# Patient Record
Sex: Male | Born: 1965 | Race: White | Hispanic: No | Marital: Married | Smoking: Former smoker
Health system: Southern US, Community
[De-identification: ages and names within clinical notes are randomized; demographics above are authoritative.]

## PROBLEM LIST (undated history)

## (undated) DIAGNOSIS — R011 Cardiac murmur, unspecified: Secondary | ICD-10-CM

## (undated) DIAGNOSIS — Z87442 Personal history of urinary calculi: Secondary | ICD-10-CM

## (undated) DIAGNOSIS — Z8679 Personal history of other diseases of the circulatory system: Secondary | ICD-10-CM

---

## 2014-04-16 ENCOUNTER — Other Ambulatory Visit (HOSPITAL_COMMUNITY): Payer: Self-pay | Admitting: Pulmonary Disease

## 2014-04-16 ENCOUNTER — Ambulatory Visit (HOSPITAL_COMMUNITY)
Admission: RE | Admit: 2014-04-16 | Discharge: 2014-04-16 | Disposition: A | Discharge status: Home / Self Care | Payer: 59 | Source: Ambulatory Visit | Attending: Pulmonary Disease | Admitting: Pulmonary Disease

## 2014-04-16 DIAGNOSIS — R05 Cough: Secondary | ICD-10-CM | POA: Insufficient documentation

## 2014-04-16 DIAGNOSIS — R509 Fever, unspecified: Secondary | ICD-10-CM | POA: Diagnosis present

## 2015-05-11 ENCOUNTER — Emergency Department (HOSPITAL_COMMUNITY)
Admission: EM | Admit: 2015-05-11 | Discharge: 2015-05-11 | Disposition: A | Discharge status: Home / Self Care | Payer: BC Managed Care – PPO | Attending: Emergency Medicine | Admitting: Emergency Medicine

## 2015-05-11 ENCOUNTER — Emergency Department (HOSPITAL_COMMUNITY): Payer: BC Managed Care – PPO

## 2015-05-11 ENCOUNTER — Encounter (HOSPITAL_COMMUNITY): Payer: Self-pay | Admitting: *Deleted

## 2015-05-11 DIAGNOSIS — N200 Calculus of kidney: Secondary | ICD-10-CM | POA: Diagnosis not present

## 2015-05-11 DIAGNOSIS — R109 Unspecified abdominal pain: Secondary | ICD-10-CM | POA: Diagnosis present

## 2015-05-11 DIAGNOSIS — R110 Nausea: Secondary | ICD-10-CM | POA: Diagnosis not present

## 2015-05-11 HISTORY — DX: Personal history of other diseases of the circulatory system: Z86.79

## 2015-05-11 HISTORY — DX: Cardiac murmur, unspecified: R01.1

## 2015-05-11 LAB — BASIC METABOLIC PANEL
Anion gap: 8 (ref 5–15)
BUN: 19 mg/dL (ref 6–20)
CALCIUM: 8.8 mg/dL — AB (ref 8.9–10.3)
CO2: 24 mmol/L (ref 22–32)
CREATININE: 1.09 mg/dL (ref 0.61–1.24)
Chloride: 104 mmol/L (ref 101–111)
GFR calc non Af Amer: >60 mL/min (ref >60)
Glucose, Bld: 116 mg/dL — ABNORMAL HIGH (ref 65–99)
Potassium: 4.2 mmol/L (ref 3.5–5.1)
SODIUM: 136 mmol/L (ref 135–145)

## 2015-05-11 LAB — CBC WITH DIFFERENTIAL/PLATELET
Basophils Absolute: 0.1 10*3/uL (ref 0.0–0.1)
Basophils Relative: 1 %
Eosinophils Absolute: 0.3 10*3/uL (ref 0.0–0.7)
Eosinophils Relative: 3 %
HEMATOCRIT: 41.9 % (ref 39.0–52.0)
Hemoglobin: 14.2 g/dL (ref 13.0–17.0)
LYMPHS ABS: 2.1 10*3/uL (ref 0.7–4.0)
LYMPHS PCT: 24 %
MCH: 29.9 pg (ref 26.0–34.0)
MCHC: 33.9 g/dL (ref 30.0–36.0)
MCV: 88.2 fL (ref 78.0–100.0)
Monocytes Absolute: 0.7 10*3/uL (ref 0.1–1.0)
Monocytes Relative: 8 %
Neutro Abs: 5.8 10*3/uL (ref 1.7–7.7)
Neutrophils Relative %: 64 %
PLATELETS: 230 10*3/uL (ref 150–400)
RBC: 4.75 MIL/uL (ref 4.22–5.81)
RDW: 12.9 % (ref 11.5–15.5)
WBC: 8.9 10*3/uL (ref 4.0–10.5)

## 2015-05-11 LAB — URINALYSIS, ROUTINE W REFLEX MICROSCOPIC
Bilirubin Urine: NEGATIVE
Glucose, UA: NEGATIVE mg/dL
Ketones, ur: NEGATIVE mg/dL
Leukocytes, UA: NEGATIVE
Nitrite: NEGATIVE
PROTEIN: NEGATIVE mg/dL
SPECIFIC GRAVITY, URINE: 1.015 (ref 1.005–1.030)
pH: 7 (ref 5.0–8.0)

## 2015-05-11 LAB — URINE MICROSCOPIC-ADD ON

## 2015-05-11 MED ORDER — KETOROLAC TROMETHAMINE 30 MG/ML IJ SOLN
30.0000 mg | Freq: Once | INTRAMUSCULAR | Status: AC
  Administered 2015-05-11: 30 mg via INTRAVENOUS
  Filled 2015-05-11: qty 1

## 2015-05-11 MED ORDER — HYDROMORPHONE HCL 1 MG/ML IJ SOLN
1.0000 mg | Freq: Once | INTRAMUSCULAR | Status: AC
  Administered 2015-05-11: 1 mg via INTRAVENOUS
  Filled 2015-05-11: qty 1

## 2015-05-11 MED ORDER — SODIUM CHLORIDE 0.9 % IV BOLUS (SEPSIS)
1000.0000 mL | Freq: Once | INTRAVENOUS | Status: AC
  Administered 2015-05-11: 1000 mL via INTRAVENOUS

## 2015-05-11 MED ORDER — ONDANSETRON HCL 4 MG/2ML IJ SOLN
4.0000 mg | Freq: Once | INTRAMUSCULAR | Status: AC
  Administered 2015-05-11: 4 mg via INTRAVENOUS
  Filled 2015-05-11: qty 2

## 2015-05-11 MED ORDER — ONDANSETRON 4 MG PO TBDP: 4.0000 mg | ORAL_TABLET | Freq: Three times a day (TID) | ORAL | Status: DC | PRN

## 2015-05-11 MED ORDER — MORPHINE SULFATE (PF) 4 MG/ML IV SOLN
4.0000 mg | Freq: Once | INTRAVENOUS | Status: AC
  Administered 2015-05-11: 4 mg via INTRAVENOUS
  Filled 2015-05-11: qty 1

## 2015-05-11 MED ORDER — TAMSULOSIN HCL 0.4 MG PO CAPS
0.4000 mg | ORAL_CAPSULE | Freq: Every day | ORAL | Status: DC
Start: 2015-05-11 — End: 2017-08-25

## 2015-05-11 MED ORDER — OXYCODONE-ACETAMINOPHEN 5-325 MG PO TABS: 1.0000 | ORAL_TABLET | Freq: Four times a day (QID) | ORAL | Status: DC | PRN

## 2015-05-11 NOTE — Discharge Instructions (Signed)
Kidney Stones °Kidney stones (urolithiasis) are deposits that form inside your kidneys. The intense pain is caused by the stone moving through the urinary tract. When the stone moves, the ureter goes into spasm around the stone. The stone is usually passed in the urine.  °CAUSES  °· A disorder that makes certain neck glands produce too much parathyroid hormone (primary hyperparathyroidism). °· A buildup of uric acid crystals, similar to gout in your joints. °· Narrowing (stricture) of the ureter. °· A kidney obstruction present at birth (congenital obstruction). °· Previous surgery on the kidney or ureters. °· Numerous kidney infections. °SYMPTOMS  °· Feeling sick to your stomach (nauseous). °· Throwing up (vomiting). °· Blood in the urine (hematuria). °· Pain that usually spreads (radiates) to the groin. °· Frequency or urgency of urination. °DIAGNOSIS  °· Taking a history and physical exam. °· Blood or urine tests. °· CT scan. °· Occasionally, an examination of the inside of the urinary bladder (cystoscopy) is performed. °TREATMENT  °· Observation. °· Increasing your fluid intake. °· Extracorporeal shock wave lithotripsy--This is a noninvasive procedure that uses shock waves to break up kidney stones. °· Surgery may be needed if you have severe pain or persistent obstruction. There are various surgical procedures. Most of the procedures are performed with the use of small instruments. Only small incisions are needed to accommodate these instruments, so recovery time is minimized. °The size, location, and chemical composition are all important variables that will determine the proper choice of action for you. Talk to your health care provider to better understand your situation so that you will minimize the risk of injury to yourself and your kidney.  °HOME CARE INSTRUCTIONS  °· Drink enough water and fluids to keep your urine clear or pale yellow. This will help you to pass the stone or stone fragments. °· Strain  all urine through the provided strainer. Keep all particulate matter and stones for your health care provider to see. The stone causing the pain may be as small as a grain of salt. It is very important to use the strainer each and every time you pass your urine. The collection of your stone will allow your health care provider to analyze it and verify that a stone has actually passed. The stone analysis will often identify what you can do to reduce the incidence of recurrences. °· Only take over-the-counter or prescription medicines for pain, discomfort, or fever as directed by your health care provider. °· Keep all follow-up visits as told by your health care provider. This is important. °· Get follow-up X-rays if required. The absence of pain does not always mean that the stone has passed. It may have only stopped moving. If the urine remains completely obstructed, it can cause loss of kidney function or even complete destruction of the kidney. It is your responsibility to make sure X-rays and follow-ups are completed. Ultrasounds of the kidney can show blockages and the status of the kidney. Ultrasounds are not associated with any radiation and can be performed easily in a matter of minutes. °· Make changes to your daily diet as told by your health care provider. You may be told to: °¨ Limit the amount of salt that you eat. °¨ Eat 5 or more servings of fruits and vegetables each day. °¨ Limit the amount of meat, poultry, fish, and eggs that you eat. °· Collect a 24-hour urine sample as told by your health care provider. You may need to collect another urine sample every 6-12   months. °SEEK MEDICAL CARE IF: °· You experience pain that is progressive and unresponsive to any pain medicine you have been prescribed. °SEEK IMMEDIATE MEDICAL CARE IF:  °· Pain cannot be controlled with the prescribed medicine. °· You have a fever or shaking chills. °· The severity or intensity of pain increases over 18 hours and is not  relieved by pain medicine. °· You develop a new onset of abdominal pain. °· You feel faint or pass out. °· You are unable to urinate. °  °This information is not intended to replace advice given to you by your health care provider. Make sure you discuss any questions you have with your health care provider. °  °Document Released: 01/05/2005 Document Revised: 09/26/2014 Document Reviewed: 06/08/2012 °Elsevier Interactive Patient Education ©2016 [employer] ° °

## 2015-05-11 NOTE — ED Notes (Signed)
Patient was transported to vehicle via wheelchair, NAD, VSS.

## 2015-05-11 NOTE — ED Provider Notes (Signed)
CSN: [id number]     Arrival date & time 05/11/15  [id number] History   First MD Initiated Contact with Patient 05/11/15 0329     Chief Complaint  Patient presents with  . Flank Pain     (Consider location/radiation/quality/duration/timing/severity/associated sxs/prior Treatment) HPI  This is a 50 year old male who presents with left flank pain. Onset of pain 11:30 PM. It was abrupt in onset. Denies any history of the same. No history of kidney stones. Reports associated nausea. No vomiting or diarrhea. Denies hematuria or dysuria. Currently rates his pain at 5 out of 10. He states that it waxes and wanes. It is sharp and radiates from his left flank into his left lower abdomen. He has not taken anything for the pain.  Past Medical History  Diagnosis Date  . Heart murmur after rheumatic heart disease    History reviewed. No pertinent past surgical history. No family history on file. Social History  Substance Use Topics  . Smoking status: Never Smoker   . Smokeless tobacco: None  . Alcohol Use: Yes     Comment: occ    Review of Systems  Constitutional: Negative for fever.  Gastrointestinal: Positive for nausea. Negative for vomiting, abdominal pain and diarrhea.  Genitourinary: Positive for flank pain. Negative for dysuria and hematuria.  All other systems reviewed and are negative.     Allergies  Review of patient's allergies indicates no known allergies.  Home Medications   Prior to Admission medications   Medication Sig Start Date End Date Taking? Authorizing Provider  ondansetron (ZOFRAN ODT) 4 MG disintegrating tablet Take 1 tablet (4 mg total) by mouth every 8 (eight) hours as needed for nausea or vomiting. 05/11/15   Merryl Hacker, MD  oxyCODONE-acetaminophen (PERCOCET/ROXICET) 5-325 MG tablet Take 1-2 tablets by mouth every 6 (six) hours as needed for severe pain. 05/11/15   Merryl Hacker, MD  tamsulosin (FLOMAX) 0.4 MG CAPS capsule Take 1 capsule (0.4 mg total) by  mouth daily. 05/11/15   Merryl Hacker, MD   BP 129/82 mmHg  Pulse 61  Temp(Src) 97.7 F (36.5 C) (Oral)  Resp 16  Ht 5\' 6"  (1.676 m)  Wt 160 lb (72.576 kg)  BMI 25.84 kg/m2  SpO2 97% Physical Exam  Constitutional: He is oriented to person, place, and time. He appears well-developed and well-nourished. No distress.  HENT:  Head: Normocephalic and atraumatic.  Cardiovascular: Normal rate, regular rhythm and normal heart sounds.   No murmur heard. Pulmonary/Chest: Effort normal and breath sounds normal. No respiratory distress. He has no wheezes.  Abdominal: Soft. Bowel sounds are normal. There is no tenderness. There is no rebound and no guarding.  Musculoskeletal: He exhibits no edema.  Neurological: He is alert and oriented to person, place, and time.  Skin: Skin is warm and dry.  Psychiatric: He has a normal mood and affect.  Nursing note and vitals reviewed.   ED Course  Procedures (including critical care time) Labs Review Labs Reviewed  URINALYSIS, ROUTINE W REFLEX MICROSCOPIC (NOT AT [facility]) - Abnormal; Notable for the following:    Hgb urine dipstick TRACE (*)    All other components within normal limits  BASIC METABOLIC PANEL - Abnormal; Notable for the following:    Glucose, Bld 116 (*)    Calcium 8.8 (*)    All other components within normal limits  URINE MICROSCOPIC-ADD ON - Abnormal; Notable for the following:    Squamous Epithelial / LPF 0-5 (*)    Bacteria, UA  RARE (*)    All other components within normal limits  CBC WITH DIFFERENTIAL/PLATELET    Imaging Review Ct Renal Stone Study  05/11/2015  CLINICAL DATA:  Initial evaluation for acute left flank pain. EXAM: CT ABDOMEN AND PELVIS WITHOUT CONTRAST TECHNIQUE: Multidetector CT imaging of the abdomen and pelvis was performed following the standard protocol without IV contrast. COMPARISON:  None. FINDINGS: Mild subsegmental atelectasis seen dependently within the visualized lung bases. Visualized lungs are  otherwise clear. Trace pericardial fluid noted. Few scattered subcentimeter hypodensities noted within the right hepatic lobe, too small the characterize, but may reflect small cysts. Limited noncontrast evaluation of the liver is otherwise unremarkable. Gallbladder within normal limits. No biliary dilatation. Spleen, adrenal glands, and pancreas demonstrate a normal unenhanced appearance. Scattered nonobstructive stones present within the right kidney, measuring up to 6 mm. No right-sided hydronephrosis or hydroureter. No radiopaque calculi seen along the course of the right renal collecting system. On the left, there is mild left hydroureteronephrosis. Obstructive 4 mm stone within the bladder lumen just beyond the left UVJ. No other radiopaque calculi seen within the dilated left ureter. Additional nonobstructive stones measuring up to 5 mm present within the mid and lower left kidney. Small hiatal hernia noted. Stomach otherwise unremarkable. No evidence for bowel obstruction. No abnormal wall thickening or inflammatory fat stranding seen about the bowels. Sigmoid diverticulosis without evidence for acute diverticulitis. Appendix is normal. Bladder partially distended with 4 mm stone in the bladder lumen. Prostate normal. No free air or fluid. No adenopathy. Small fat containing paraumbilical hernia noted. No acute osseous abnormality. Degenerative disc bulge noted at L5-S1. No worrisome lytic or blastic osseous lesions. IMPRESSION: 1. 4 mm obstructive stone within the bladder lumen just beyond the left UVJ with secondary mild left hydroureteronephrosis. 2. Additional nonobstructive nephrolithiasis as above. 3. Colonic diverticulosis without evidence for acute diverticulitis. Electronically Signed   By: Jeannine Boga M.D.   On: 05/11/2015 05:40   I have personally reviewed and evaluated these images and lab results as part of my medical decision-making.   EKG Interpretation None      MDM    Final diagnoses:  Kidney stone    Patient presents with acute onset of left flank pain. Currently he appears comfortable but states that the pain waxes and wanes. No history of kidney stones but history is suggestive. Exam is reassuring. Vital signs stable. Patient was given pain and nausea medication. Lab work was obtained as well as a CT scan given that he has no prior known history. CT scan shows a 4 mm obstructing stone at the bladder lumen. This is likely the cause of the patient's pain. On recheck, he states he feels much better. He was able to tolerate fluids. Patient was given instructions regarding expectant management.  After history, exam, and medical workup I feel the patient has been appropriately medically screened and is safe for discharge home. Pertinent diagnoses were discussed with the patient. Patient was given return precautions.     Merryl Hacker, MD 05/11/15 [phone]

## 2015-05-11 NOTE — ED Notes (Signed)
Patient was given water and peanut butter crackers for consumption.

## 2015-05-11 NOTE — ED Notes (Signed)
Pt states left flank pain that started around 2330

## 2016-01-28 DIAGNOSIS — J00 Acute nasopharyngitis [common cold]: Secondary | ICD-10-CM | POA: Diagnosis not present

## 2016-10-05 DIAGNOSIS — R05 Cough: Secondary | ICD-10-CM | POA: Diagnosis not present

## 2016-10-05 DIAGNOSIS — W57XXXA Bitten or stung by nonvenomous insect and other nonvenomous arthropods, initial encounter: Secondary | ICD-10-CM | POA: Diagnosis not present

## 2017-03-04 DIAGNOSIS — R300 Dysuria: Secondary | ICD-10-CM | POA: Diagnosis not present

## 2017-03-04 DIAGNOSIS — M545 Low back pain: Secondary | ICD-10-CM | POA: Diagnosis not present

## 2017-03-11 ENCOUNTER — Encounter (INDEPENDENT_AMBULATORY_CARE_PROVIDER_SITE_OTHER): Payer: Self-pay | Admitting: *Deleted

## 2017-05-10 ENCOUNTER — Encounter (INDEPENDENT_AMBULATORY_CARE_PROVIDER_SITE_OTHER): Payer: Self-pay | Admitting: *Deleted

## 2017-05-14 ENCOUNTER — Other Ambulatory Visit (INDEPENDENT_AMBULATORY_CARE_PROVIDER_SITE_OTHER): Payer: Self-pay | Admitting: *Deleted

## 2017-05-14 DIAGNOSIS — Z1211 Encounter for screening for malignant neoplasm of colon: Secondary | ICD-10-CM | POA: Insufficient documentation

## 2017-07-19 ENCOUNTER — Telehealth (INDEPENDENT_AMBULATORY_CARE_PROVIDER_SITE_OTHER): Payer: Self-pay | Admitting: *Deleted

## 2017-07-19 ENCOUNTER — Encounter (INDEPENDENT_AMBULATORY_CARE_PROVIDER_SITE_OTHER): Payer: Self-pay | Admitting: *Deleted

## 2017-07-19 MED ORDER — PEG 3350-KCL-NA BICARB-NACL 420 G PO SOLR: 4000.0000 mL | Freq: Once | ORAL | 0 refills | Status: AC

## 2017-07-19 NOTE — Telephone Encounter (Signed)
Patient needs trilyte 

## 2017-07-27 ENCOUNTER — Telehealth (INDEPENDENT_AMBULATORY_CARE_PROVIDER_SITE_OTHER): Payer: Self-pay | Admitting: *Deleted

## 2017-07-27 NOTE — Telephone Encounter (Signed)
agree

## 2017-07-27 NOTE — Telephone Encounter (Signed)
Referring MD/PCP: hawkins   Procedure: tcs  Reason/Indication:  screening  Has patient had this procedure before?  no  If so, when, by whom and where?    Is there a family history of colon cancer?  no  Who?  What age when diagnosed?    Is patient diabetic?   no      Does patient have prosthetic heart valve or mechanical valve?  no  Do you have a pacemaker?  no  Has patient ever had endocarditis? no  Has patient had joint replacement within last 12 months?  no  Is patient constipated or do they take laxatives? no  Does patient have a history of alcohol/drug use?  no  Is patient on blood thinner such as Coumadin, Plavix and/or Aspirin? no  Medications: none  Allergies: nkda  Medication Adjustment per Dr Lindi Adie, NP:   Procedure date & time: 08/25/17 at 830

## 2017-08-25 ENCOUNTER — Encounter (HOSPITAL_COMMUNITY)
Admission: RE | Disposition: A | Discharge status: Home / Self Care | Payer: Self-pay | Source: Ambulatory Visit | Attending: Internal Medicine

## 2017-08-25 ENCOUNTER — Encounter (HOSPITAL_COMMUNITY): Payer: Self-pay | Admitting: *Deleted

## 2017-08-25 ENCOUNTER — Other Ambulatory Visit: Payer: Self-pay

## 2017-08-25 ENCOUNTER — Ambulatory Visit (HOSPITAL_COMMUNITY)
Admission: RE | Admit: 2017-08-25 | Discharge: 2017-08-25 | Disposition: A | Discharge status: Home / Self Care | Payer: BLUE CROSS/BLUE SHIELD | Source: Ambulatory Visit | Attending: Internal Medicine | Admitting: Internal Medicine

## 2017-08-25 DIAGNOSIS — K5730 Diverticulosis of large intestine without perforation or abscess without bleeding: Secondary | ICD-10-CM | POA: Diagnosis not present

## 2017-08-25 DIAGNOSIS — Z8679 Personal history of other diseases of the circulatory system: Secondary | ICD-10-CM | POA: Diagnosis not present

## 2017-08-25 DIAGNOSIS — Z87891 Personal history of nicotine dependence: Secondary | ICD-10-CM | POA: Diagnosis not present

## 2017-08-25 DIAGNOSIS — Z79899 Other long term (current) drug therapy: Secondary | ICD-10-CM | POA: Insufficient documentation

## 2017-08-25 DIAGNOSIS — Z87442 Personal history of urinary calculi: Secondary | ICD-10-CM | POA: Insufficient documentation

## 2017-08-25 DIAGNOSIS — Z1211 Encounter for screening for malignant neoplasm of colon: Secondary | ICD-10-CM | POA: Diagnosis not present

## 2017-08-25 DIAGNOSIS — D123 Benign neoplasm of transverse colon: Secondary | ICD-10-CM | POA: Diagnosis not present

## 2017-08-25 HISTORY — PX: POLYPECTOMY: SHX5525

## 2017-08-25 HISTORY — DX: Personal history of urinary calculi: Z87.442

## 2017-08-25 HISTORY — PX: COLONOSCOPY: SHX5424

## 2017-08-25 SURGERY — COLONOSCOPY
Anesthesia: Moderate Sedation

## 2017-08-25 MED ORDER — MIDAZOLAM HCL 5 MG/5ML IJ SOLN
INTRAMUSCULAR | Status: DC | PRN
  Administered 2017-08-25: 2 mg via INTRAVENOUS
  Administered 2017-08-25: 1 mg via INTRAVENOUS
  Administered 2017-08-25: 2 mg via INTRAVENOUS

## 2017-08-25 MED ORDER — STERILE WATER FOR IRRIGATION IR SOLN
Status: DC | PRN
  Administered 2017-08-25: 09:00:00

## 2017-08-25 MED ORDER — SODIUM CHLORIDE 0.9 % IV SOLN
INTRAVENOUS | Status: DC
  Administered 2017-08-25: 1000 mL via INTRAVENOUS

## 2017-08-25 MED ORDER — MIDAZOLAM HCL 5 MG/5ML IJ SOLN
INTRAMUSCULAR | Status: AC
  Filled 2017-08-25: qty 10

## 2017-08-25 MED ORDER — MEPERIDINE HCL 50 MG/ML IJ SOLN
INTRAMUSCULAR | Status: AC
  Filled 2017-08-25: qty 1

## 2017-08-25 MED ORDER — MEPERIDINE HCL 50 MG/ML IJ SOLN
INTRAMUSCULAR | Status: DC | PRN
  Administered 2017-08-25 (×2): 25 mg via INTRAVENOUS

## 2017-08-25 NOTE — Op Note (Signed)
[facility] Patient Name: Kurt Rosales Procedure Date: 08/25/2017 9:06 AM MRN: [id number] Date of Birth: 13-Jul-1965 Attending MD: Hildred Laser , MD CSN: [id number] Age: 52 Admit Type: Outpatient Procedure:                Colonoscopy Indications:              Screening for colorectal malignant neoplasm Providers:                Hildred Laser, MD, Otis Peak B. Sharon Seller, RN, Nelma Rothman, Technician Referring MD:             Jasper Loser. Luan Pulling, MD Medicines:                Meperidine 50 mg IV, Midazolam 5 mg IV Complications:            No immediate complications. Estimated Blood Loss:     Estimated blood loss was minimal. Procedure:                Pre-Anesthesia Assessment:                           - Prior to the procedure, a History and Physical                            was performed, and patient medications and                            allergies were reviewed. The patient's tolerance of                            previous anesthesia was also reviewed. The risks                            and benefits of the procedure and the sedation                            options and risks were discussed with the patient.                            All questions were answered, and informed consent                            was obtained. Prior Anticoagulants: The patient has                            taken no previous anticoagulant or antiplatelet                            agents. ASA Grade Assessment: I - A normal, healthy                            patient. After reviewing the risks and benefits,  the patient was deemed in satisfactory condition to                            undergo the procedure.                           After obtaining informed consent, the colonoscope                            was passed under direct vision. Throughout the                            procedure, the patient's blood pressure, pulse, and                oxygen saturations were monitored continuously. The                            [id number]-[id number] ([id number]) scope was introduced through                            the anus and advanced to the the cecum, identified                            by appendiceal orifice and ileocecal valve. The                            colonoscopy was performed without difficulty. The                            patient tolerated the procedure well. The quality                            of the bowel preparation was good. The ileocecal                            valve, appendiceal orifice, and rectum were                            photographed. Scope In: 9:29:11 AM Scope Out: 9:54:33 AM Scope Withdrawal Time: 0 hours 19 minutes 9 seconds  Total Procedure Duration: 0 hours 25 minutes 22 seconds  Findings:      The perianal and digital rectal examinations were normal.      Three sessile polyps were found in the splenic flexure and hepatic       flexure. The polyps were 4 to 6 mm in size. These polyps were removed       with a cold snare. Resection was complete, but the polyp tissue was not       retrieved. The pathology specimen was placed into Bottle Number 1.      Multiple small and medium mouthed diverticula were found in the sigmoid       colon.      The retroflexed view of the distal rectum and anal verge was normal and       showed no anal or rectal abnormalities. Impression:               -  Three 4 to 6 mm polyps at the splenic flexure and                            at the hepatic flexure, removed with a cold snare.                            Complete resection. One polyp tissue not retrieved.                           - Diverticulosis in the sigmoid colon. Moderate Sedation:      Moderate (conscious) sedation was administered by the endoscopy nurse       and supervised by the endoscopist. The following parameters were       monitored: oxygen saturation, heart rate, blood pressure, CO2        capnography and response to care. Total physician intraservice time was       31 minutes. Recommendation:           - Patient has a contact number available for                            emergencies. The signs and symptoms of potential                            delayed complications were discussed with the                            patient. Return to normal activities tomorrow.                            Written discharge instructions were provided to the                            patient.                           - High fiber diet today.                           - Continue present medications.                           - No aspirin, ibuprofen, naproxen, or other                            non-steroidal anti-inflammatory drugs for 1 day.                           - Await pathology results.                           - Repeat colonoscopy is recommended. The                            colonoscopy date will be determined after pathology  results from today's exam become available for                            review. Procedure Code(s):        --- Professional ---                           [phone], Colonoscopy, flexible; with removal of                            tumor(s), polyp(s), or other lesion(s) by snare                            technique                           G0500, Moderate sedation services provided by the                            same physician or other qualified health care                            professional performing a gastrointestinal                            endoscopic service that sedation supports,                            requiring the presence of an independent trained                            observer to assist in the monitoring of the                            patient's level of consciousness and physiological                            status; initial 15 minutes of intra-service time;                            patient  age 1 years or older (additional time may                            be reported with [phone], as appropriate)                           [id number], Moderate sedation services provided by the                            same physician or other qualified health care                            professional performing the diagnostic or  therapeutic service that the sedation supports,                            requiring the presence of an independent trained                            observer to assist in the monitoring of the                            patient's level of consciousness and physiological                            status; each additional 15 minutes intraservice                            time (List separately in addition to code for                            primary service) Diagnosis Code(s):        --- Professional ---                           Z12.11, Encounter for screening for malignant                            neoplasm of colon                           D12.3, Benign neoplasm of transverse colon (hepatic                            flexure or splenic flexure)                           K57.30, Diverticulosis of large intestine without                            perforation or abscess without bleeding CPT copyright 2017 [employer]. All rights reserved. The codes documented in this report are preliminary and upon coder review may  be revised to meet current compliance requirements. Hildred Laser, MD Hildred Laser, MD 08/25/2017 10:02:09 AM This report has been signed electronically. Number of Addenda: 0

## 2017-08-25 NOTE — H&P (Addendum)
Kurt Rosales is an 52 y.o. male.   Chief Complaint: Patient is here for colonoscopy. HPI: Patient 52 year old Caucasian male who is here for screening colonoscopy.  He denies abdominal pain change in bowel habits or rectal bleeding. Family history is not available as he was adopted.  Past Medical History:  Diagnosis Date  . Heart murmur after rheumatic heart disease    had Rheumatic fever as a child  . History of kidney stones     History reviewed. No pertinent surgical history.  History reviewed. No pertinent family history. Social History:  reports that he has quit smoking. He has quit using smokeless tobacco. He reports that he drinks alcohol. He reports that he does not use drugs.  Allergies: No Known Allergies  Medications Prior to Admission  Medication Sig Dispense Refill  . cetirizine (ZYRTEC) 10 MG tablet Take 10 mg by mouth daily as needed for allergies.    . fluticasone (FLONASE) 50 MCG/ACT nasal spray Place 1 spray into both nostrils daily as needed for allergies or rhinitis.    Marland Kitchen MELATONIN PO Take 1 tablet by mouth at bedtime as needed (sleep).    Marland Kitchen ibuprofen (ADVIL,MOTRIN) 200 MG tablet Take 400-600 mg by mouth daily as needed for headache or moderate pain.    . tamsulosin (FLOMAX) 0.4 MG CAPS capsule Take 1 capsule (0.4 mg total) by mouth daily. (Patient not taking: Reported on 08/19/2017) 15 capsule 0    No results found for this or any previous visit (from the past 48 hour(s)). No results found.  ROS  Blood pressure 114/78, pulse (!) 58, temperature 98.2 F (36.8 C), temperature source Oral, resp. rate 17, height 5\' 6"  (1.676 m), weight 160 lb (72.6 kg), SpO2 100 %. Physical Exam  Constitutional: He appears well-developed and well-nourished.  HENT:  Mouth/Throat: Oropharynx is clear and moist.  Eyes: Conjunctivae are normal. No scleral icterus.  Neck: No thyromegaly present.  Cardiovascular: Normal rate, regular rhythm and normal heart sounds.  No murmur  heard. Respiratory: Effort normal and breath sounds normal.  GI: Soft. He exhibits no distension and no mass. There is no tenderness.  Musculoskeletal: He exhibits no edema.  Neurological: He is alert.  Skin: Skin is warm and dry.     Assessment/Plan   Average risk screening colonoscopy.   Hildred Laser, MD 08/25/2017, 9:20 AM

## 2017-08-25 NOTE — Discharge Instructions (Signed)
No aspirin or NSAIDs for 1 week. Resume usual medications as before. High-fiber diet. No driving for 24 hours.   Physician will call with biopsy results.   Colonoscopy, Adult, Care After This sheet gives you information about how to care for yourself after your procedure. Your health care provider may also give you more specific instructions. If you have problems or questions, contact your health care provider. What can I expect after the procedure? After the procedure, it is common to have:  A small amount of blood in your stool for 24 hours after the procedure.  Some gas.  Mild abdominal cramping or bloating.  Follow these instructions at home: General instructions   For the first 24 hours after the procedure: ? Do not drive or use machinery. ? Do not sign important documents. ? Do not drink alcohol. ? Do your regular daily activities at a slower pace than normal. ? Eat soft, easy-to-digest foods. ? Rest often.  Take over-the-counter or prescription medicines only as told by your health care provider.  It is up to you to get the results of your procedure. Ask your health care provider, or the department performing the procedure, when your results will be ready. Relieving cramping and bloating  Try walking around when you have cramps or feel bloated.  Apply heat to your abdomen as told by your health care provider. Use a heat source that your health care provider recommends, such as a moist heat pack or a heating pad. ? Place a towel between your skin and the heat source. ? Leave the heat on for 20-30 minutes. ? Remove the heat if your skin turns bright red. This is especially important if you are unable to feel pain, heat, or cold. You may have a greater risk of getting burned. Eating and drinking  Drink enough fluid to keep your urine clear or pale yellow.  Resume your normal diet as instructed by your health care provider. Avoid heavy or fried foods that are hard to  digest.  Avoid drinking alcohol for as long as instructed by your health care provider. Contact a health care provider if:  You have blood in your stool 2-3 days after the procedure. Get help right away if:  You have more than a small spotting of blood in your stool.  You pass large blood clots in your stool.  Your abdomen is swollen.  You have nausea or vomiting.  You have a fever.  You have increasing abdominal pain that is not relieved with medicine. This information is not intended to replace advice given to you by your health care provider. Make sure you discuss any questions you have with your health care provider.   Colon Polyps Polyps are tissue growths inside the body. Polyps can grow in many places, including the large intestine (colon). A polyp may be a round bump or a mushroom-shaped growth. You could have one polyp or several. Most colon polyps are noncancerous (benign). However, some colon polyps can become cancerous over time. What are the causes? The exact cause of colon polyps is not known. What increases the risk? This condition is more likely to develop in people who:  Have a family history of colon cancer or colon polyps.  Are older than 85 or older than 45 if they are African American.  Have inflammatory bowel disease, such as ulcerative colitis or Crohn disease.  Are overweight.  Smoke cigarettes.  Do not get enough exercise.  Drink too much alcohol.  Eat  a diet that is: ? High in fat and red meat. ? Low in fiber.  Had childhood cancer that was treated with abdominal radiation.  What are the signs or symptoms? Most polyps do not cause symptoms. If you have symptoms, they may include:  Blood coming from your rectum when having a bowel movement.  Blood in your stool.The stool may look dark red or black.  A change in bowel habits, such as constipation or diarrhea.  How is this diagnosed? This condition is diagnosed with a colonoscopy.  This is a procedure that uses a lighted, flexible scope to look at the inside of your colon. How is this treated? Treatment for this condition involves removing any polyps that are found. Those polyps will then be tested for cancer. If cancer is found, your health care provider will talk to you about options for colon cancer treatment. Follow these instructions at home: Diet  Eat plenty of fiber, such as fruits, vegetables, and whole grains.  Eat foods that are high in calcium and vitamin D, such as milk, cheese, yogurt, eggs, liver, fish, and broccoli.  Limit foods high in fat, red meats, and processed meats, such as hot dogs, sausage, bacon, and lunch meats.  Maintain a healthy weight, or lose weight if recommended by your health care provider. General instructions  Do not smoke cigarettes.  Do not drink alcohol excessively.  Keep all follow-up visits as told by your health care provider. This is important. This includes keeping regularly scheduled colonoscopies. Talk to your health care provider about when you need a colonoscopy.  Exercise every day or as told by your health care provider. Contact a health care provider if:  You have new or worsening bleeding during a bowel movement.  You have new or increased blood in your stool.  You have a change in bowel habits.  You unexpectedly lose weight.   Diverticulosis Diverticulosis is a condition that develops when small pouches (diverticula) form in the wall of the large intestine (colon). The colon is where water is absorbed and stool is formed. The pouches form when the inside layer of the colon pushes through weak spots in the outer layers of the colon. You may have a few pouches or many of them. What are the causes? The cause of this condition is not known. What increases the risk? The following factors may make you more likely to develop this condition:  Being older than age 70. Your risk for this condition increases with  age. Diverticulosis is rare among people younger than age 18. By age 59, many people have it.  Eating a low-fiber diet.  Having frequent constipation.  Being overweight.  Not getting enough exercise.  Smoking.  Taking over-the-counter pain medicines, like aspirin and ibuprofen.  Having a family history of diverticulosis.  What are the signs or symptoms? In most people, there are no symptoms of this condition. If you do have symptoms, they may include:  Bloating.  Cramps in the abdomen.  Constipation or diarrhea.  Pain in the lower left side of the abdomen.  How is this diagnosed? This condition is most often diagnosed during an exam for other colon problems. Because diverticulosis usually has no symptoms, it often cannot be diagnosed independently. This condition may be diagnosed by:  Using a flexible scope to examine the colon (colonoscopy).  Taking an X-ray of the colon after dye has been put into the colon (barium enema).  Doing a CT scan.  How is this treated?  You may not need treatment for this condition if you have never developed an infection related to diverticulosis. If you have had an infection before, treatment may include:  Eating a high-fiber diet. This may include eating more fruits, vegetables, and grains.  Taking a fiber supplement.  Taking a live bacteria supplement (probiotic).  Taking medicine to relax your colon.  Taking antibiotic medicines.  Follow these instructions at home:  Drink 6-8 glasses of water or more each day to prevent constipation.  Try not to strain when you have a bowel movement.  If you have had an infection before: ? Eat more fiber as directed by your health care provider or your diet and nutrition specialist (dietitian). ? Take a fiber supplement or probiotic, if your health care provider approves.  Take over-the-counter and prescription medicines only as told by your health care provider.  If you were prescribed an  antibiotic, take it as told by your health care provider. Do not stop taking the antibiotic even if you start to feel better.  Keep all follow-up visits as told by your health care provider. This is important. Contact a health care provider if:  You have pain in your abdomen.  You have bloating.  You have cramps.  You have not had a bowel movement in 3 days. Get help right away if:  Your pain gets worse.  Your bloating becomes very bad.  You have a fever or chills, and your symptoms suddenly get worse.  You vomit.  You have bowel movements that are bloody or black.  You have bleeding from your rectum. Summary  Diverticulosis is a condition that develops when small pouches (diverticula) form in the wall of the large intestine (colon).  You may have a few pouches or many of them.  This condition is most often diagnosed during an exam for other colon problems.  If you have had an infection related to diverticulosis, treatment may include increasing the fiber in your diet, taking supplements, or taking medicines. This information is not intended to replace advice given to you by your health care provider. Make sure you discuss any questions you have with your health care provider.

## 2017-08-30 ENCOUNTER — Encounter (HOSPITAL_COMMUNITY): Payer: Self-pay | Admitting: Internal Medicine

## 2017-09-19 DIAGNOSIS — R05 Cough: Secondary | ICD-10-CM | POA: Diagnosis not present

## 2018-02-08 IMAGING — CT CT RENAL STONE PROTOCOL
2 of 4 series · 16 of 46 positions shown, 18 images · non-contrast
Comparison: None.

CLINICAL DATA: Initial evaluation for acute left flank pain.

EXAM:
CT ABDOMEN AND PELVIS WITHOUT CONTRAST
TECHNIQUE: Multidetector CT imaging of the abdomen and pelvis was performed
following the standard protocol without IV contrast.

[Series 2: standard/full over (age)lbs 5.0 · axial · 0.70mm/px · z∈[-565,-145]mm · 13 of 92 slices shown, 15 images]
[im 4/92  soft-tissue]
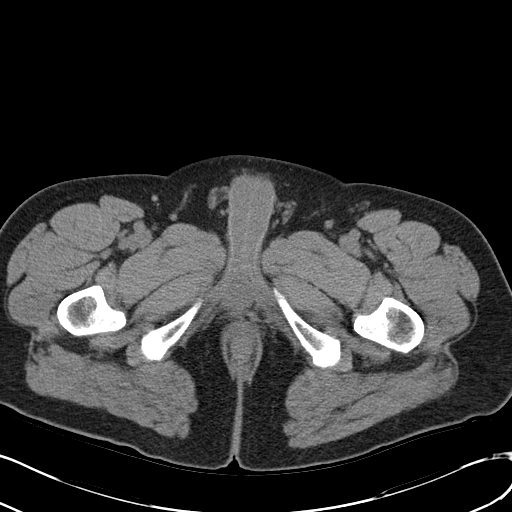
[im 4/92  bone]
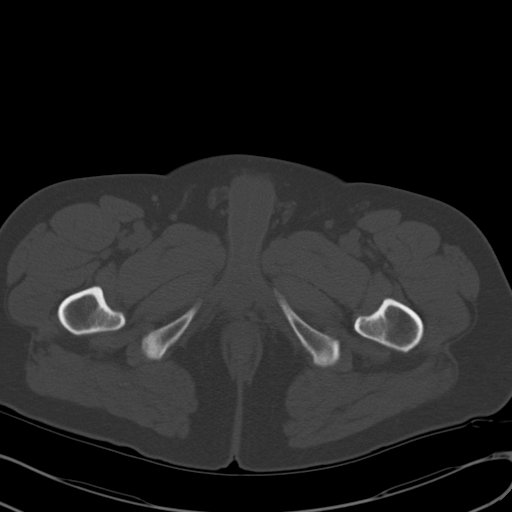
[im 12/92  soft-tissue]
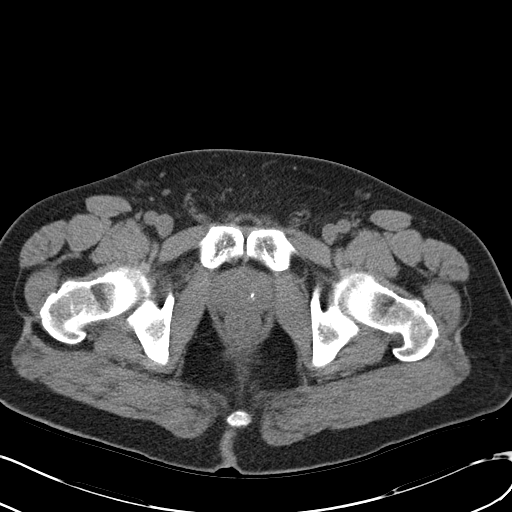
[im 19/92  soft-tissue]
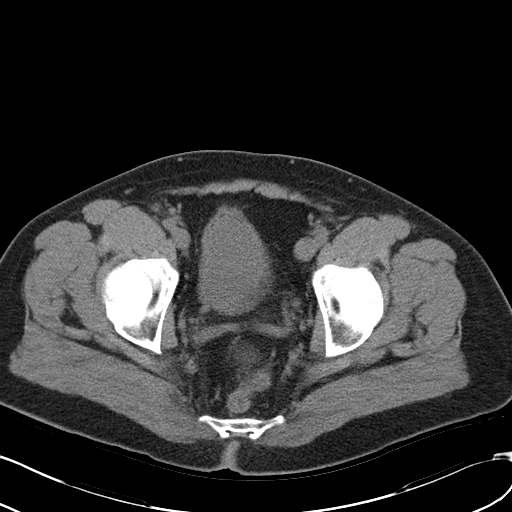
[im 27/92  soft-tissue]
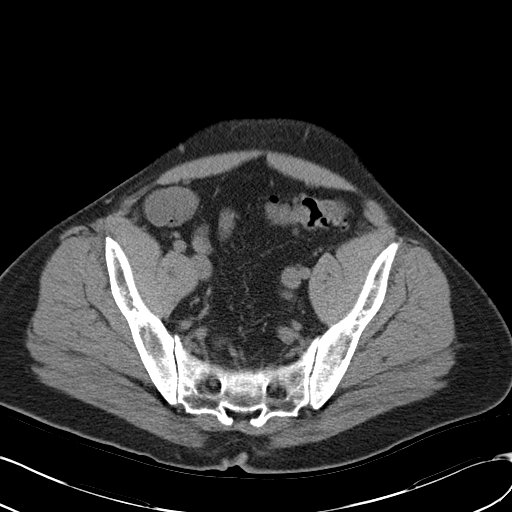
[im 31/92  soft-tissue]
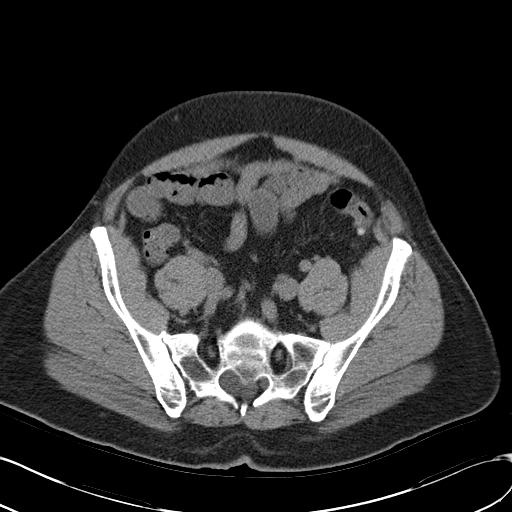
[im 38/92  soft-tissue]
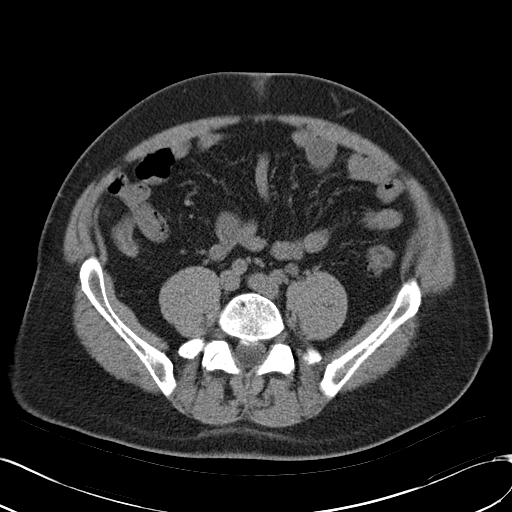
[im 46/92  soft-tissue]
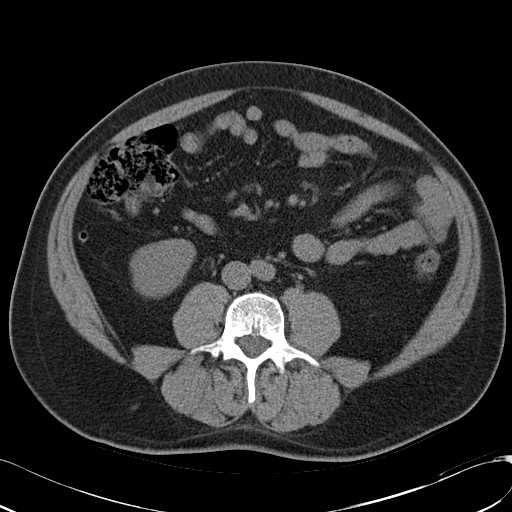
[im 54/92  soft-tissue]
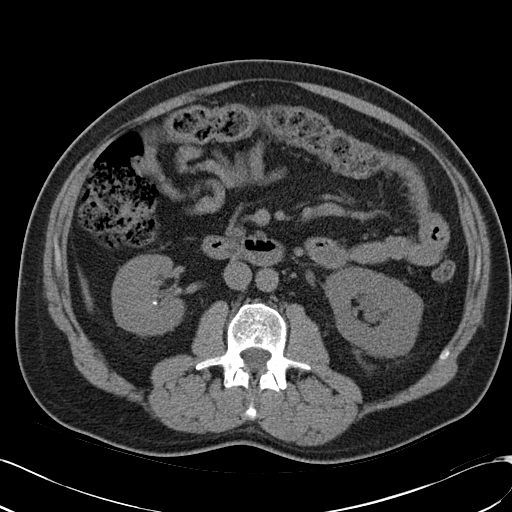
[im 61/92  soft-tissue]
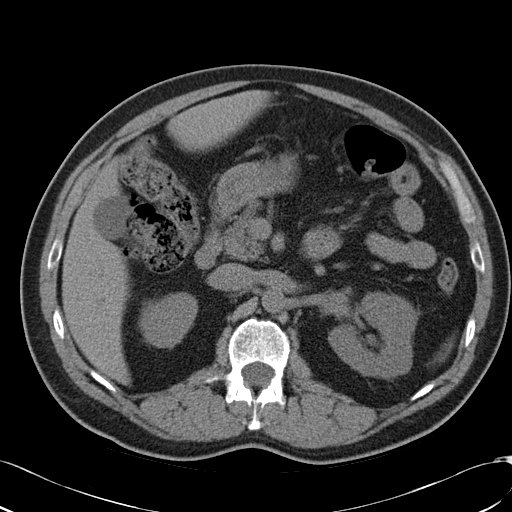
[im 61/92  bone]
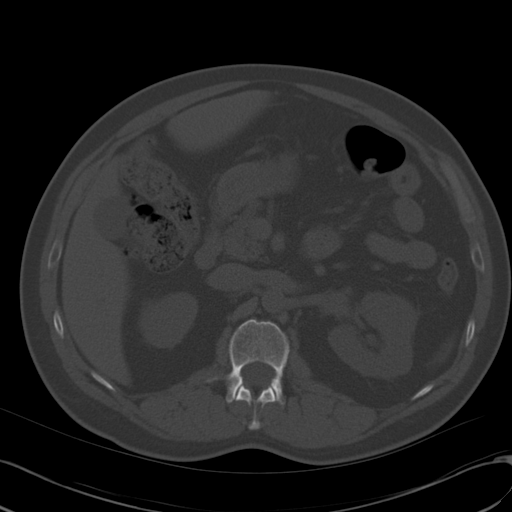
[im 65/92  soft-tissue]
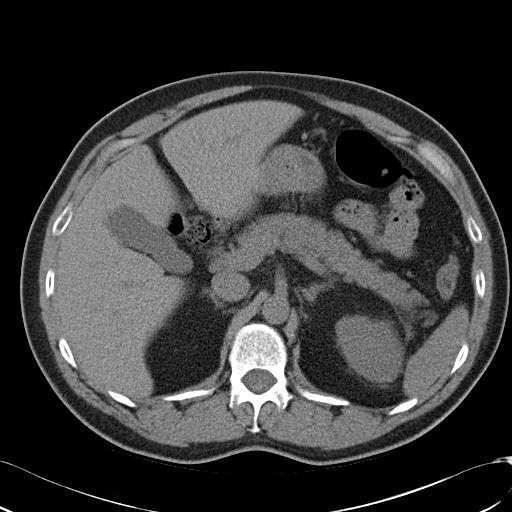
[im 73/92  soft-tissue]
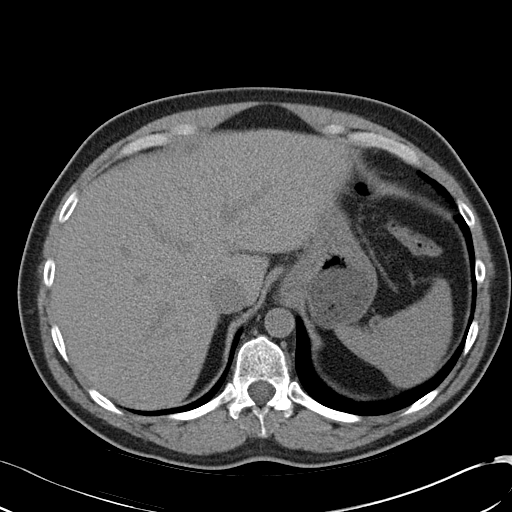
[im 80/92  soft-tissue]
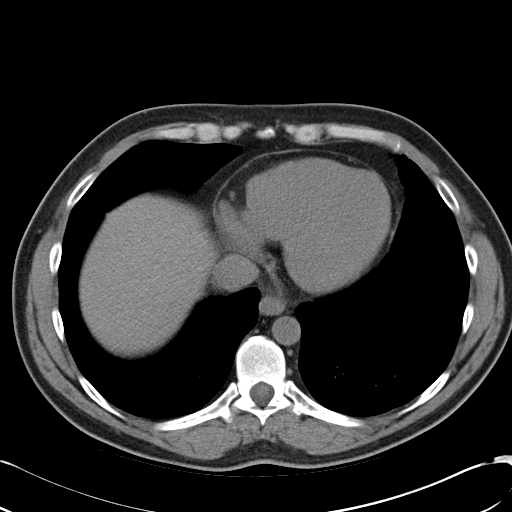
[im 88/92  soft-tissue]
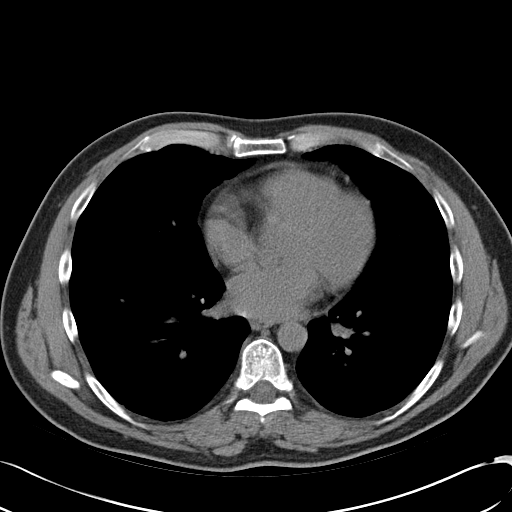

[Series 3: mpr coronal · coronal · 0.87mm/px · 3 of 102 slices shown]
[im 34/102  soft-tissue]
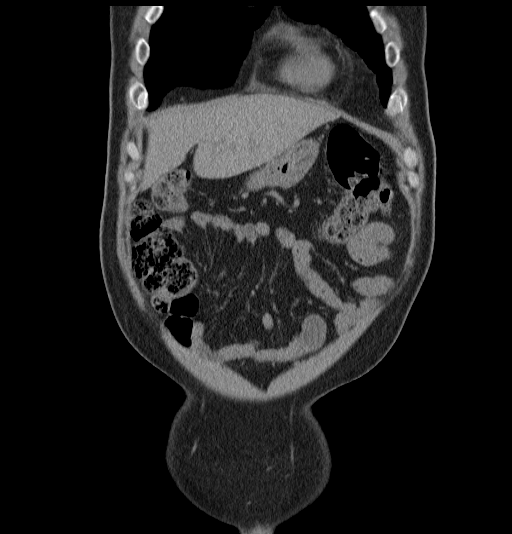
[im 45/102  soft-tissue]
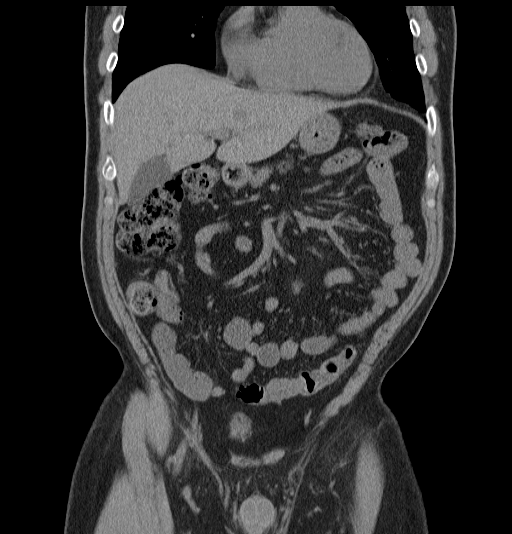
[im 57/102  soft-tissue]
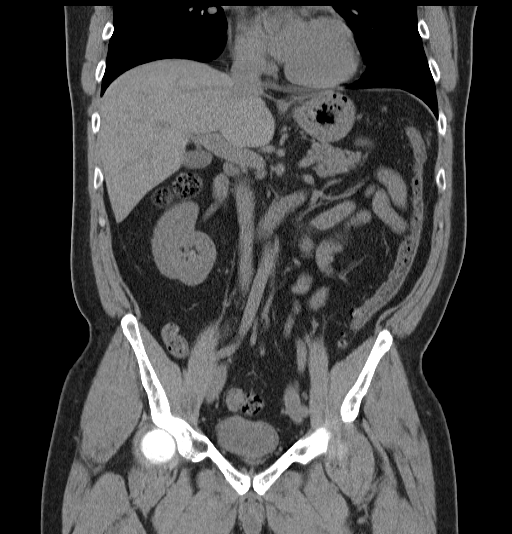

[16 of 46 positions shown; findings below may reference images not displayed]

FINDINGS: Mild subsegmental atelectasis seen dependently within the visualized
lung bases. Visualized lungs are otherwise clear. Trace pericardial
fluid noted.

Few scattered subcentimeter hypodensities noted within the right
hepatic lobe, too small the characterize, but may reflect small
cysts. Limited noncontrast evaluation of the liver is otherwise
unremarkable. Gallbladder within normal limits. No biliary
dilatation. Spleen, adrenal glands, and pancreas demonstrate a
normal unenhanced appearance.

Scattered nonobstructive stones present within the right kidney,
measuring up to 6 mm. No right-sided hydronephrosis or hydroureter.
No radiopaque calculi seen along the course of the right renal
collecting system.

On the left, there is mild left hydroureteronephrosis. Obstructive 4
mm stone within the bladder lumen just beyond the left UVJ. No other
radiopaque calculi seen within the dilated left ureter. Additional
nonobstructive stones measuring up to 5 mm present within the mid
and lower left kidney.

Small hiatal hernia noted. Stomach otherwise unremarkable. No
evidence for bowel obstruction. No abnormal wall thickening or
inflammatory fat stranding seen about the bowels. Sigmoid
diverticulosis without evidence for acute diverticulitis. Appendix
is normal.

Bladder partially distended with 4 mm stone in the bladder lumen.
Prostate normal.

No free air or fluid. No adenopathy. Small fat containing
paraumbilical hernia noted.

No acute osseous abnormality. Degenerative disc bulge noted at
L5-S1. No worrisome lytic or blastic osseous lesions.
IMPRESSION: 1. 4 mm obstructive stone within the bladder lumen just beyond the
left UVJ with secondary mild left hydroureteronephrosis.
2. Additional nonobstructive nephrolithiasis as above.
3. Colonic diverticulosis without evidence for acute diverticulitis.

## 2018-11-23 DIAGNOSIS — D225 Melanocytic nevi of trunk: Secondary | ICD-10-CM | POA: Diagnosis not present

## 2018-11-23 DIAGNOSIS — D485 Neoplasm of uncertain behavior of skin: Secondary | ICD-10-CM | POA: Diagnosis not present

## 2018-11-23 DIAGNOSIS — D1801 Hemangioma of skin and subcutaneous tissue: Secondary | ICD-10-CM | POA: Diagnosis not present

## 2018-11-23 DIAGNOSIS — L570 Actinic keratosis: Secondary | ICD-10-CM | POA: Diagnosis not present

## 2018-11-23 DIAGNOSIS — L821 Other seborrheic keratosis: Secondary | ICD-10-CM | POA: Diagnosis not present

## 2018-11-29 DIAGNOSIS — D485 Neoplasm of uncertain behavior of skin: Secondary | ICD-10-CM | POA: Diagnosis not present

## 2018-11-29 DIAGNOSIS — L988 Other specified disorders of the skin and subcutaneous tissue: Secondary | ICD-10-CM | POA: Diagnosis not present

## 2019-01-27 DIAGNOSIS — H00021 Hordeolum internum right upper eyelid: Secondary | ICD-10-CM | POA: Diagnosis not present

## 2019-02-22 DIAGNOSIS — E785 Hyperlipidemia, unspecified: Secondary | ICD-10-CM | POA: Diagnosis not present

## 2019-03-10 DIAGNOSIS — H1089 Other conjunctivitis: Secondary | ICD-10-CM | POA: Diagnosis not present

## 2019-03-21 DIAGNOSIS — Z1329 Encounter for screening for other suspected endocrine disorder: Secondary | ICD-10-CM | POA: Diagnosis not present

## 2019-03-21 DIAGNOSIS — Z0000 Encounter for general adult medical examination without abnormal findings: Secondary | ICD-10-CM | POA: Diagnosis not present

## 2019-03-21 DIAGNOSIS — R7301 Impaired fasting glucose: Secondary | ICD-10-CM | POA: Diagnosis not present

## 2019-03-21 DIAGNOSIS — E785 Hyperlipidemia, unspecified: Secondary | ICD-10-CM | POA: Diagnosis not present

## 2019-03-21 DIAGNOSIS — H0011 Chalazion right upper eyelid: Secondary | ICD-10-CM | POA: Diagnosis not present

## 2019-03-29 DIAGNOSIS — Z0000 Encounter for general adult medical examination without abnormal findings: Secondary | ICD-10-CM | POA: Diagnosis not present

## 2019-08-02 DIAGNOSIS — A0839 Other viral enteritis: Secondary | ICD-10-CM | POA: Diagnosis not present

## 2019-11-21 DIAGNOSIS — D239 Other benign neoplasm of skin, unspecified: Secondary | ICD-10-CM | POA: Diagnosis not present

## 2020-04-02 DIAGNOSIS — Z0000 Encounter for general adult medical examination without abnormal findings: Secondary | ICD-10-CM | POA: Diagnosis not present

## 2020-04-02 DIAGNOSIS — E785 Hyperlipidemia, unspecified: Secondary | ICD-10-CM | POA: Diagnosis not present

## 2020-04-08 DIAGNOSIS — Z0001 Encounter for general adult medical examination with abnormal findings: Secondary | ICD-10-CM | POA: Diagnosis not present

## 2021-01-17 DIAGNOSIS — M9902 Segmental and somatic dysfunction of thoracic region: Secondary | ICD-10-CM | POA: Diagnosis not present

## 2021-01-17 DIAGNOSIS — M546 Pain in thoracic spine: Secondary | ICD-10-CM | POA: Diagnosis not present

## 2021-01-17 DIAGNOSIS — M9903 Segmental and somatic dysfunction of lumbar region: Secondary | ICD-10-CM | POA: Diagnosis not present

## 2021-01-17 DIAGNOSIS — M62830 Muscle spasm of back: Secondary | ICD-10-CM | POA: Diagnosis not present

## 2021-01-24 DIAGNOSIS — M9903 Segmental and somatic dysfunction of lumbar region: Secondary | ICD-10-CM | POA: Diagnosis not present

## 2021-01-24 DIAGNOSIS — M9902 Segmental and somatic dysfunction of thoracic region: Secondary | ICD-10-CM | POA: Diagnosis not present

## 2021-01-24 DIAGNOSIS — M546 Pain in thoracic spine: Secondary | ICD-10-CM | POA: Diagnosis not present

## 2021-01-24 DIAGNOSIS — M62830 Muscle spasm of back: Secondary | ICD-10-CM | POA: Diagnosis not present

## 2021-01-31 DIAGNOSIS — M62830 Muscle spasm of back: Secondary | ICD-10-CM | POA: Diagnosis not present

## 2021-01-31 DIAGNOSIS — M9903 Segmental and somatic dysfunction of lumbar region: Secondary | ICD-10-CM | POA: Diagnosis not present

## 2021-01-31 DIAGNOSIS — M546 Pain in thoracic spine: Secondary | ICD-10-CM | POA: Diagnosis not present

## 2021-01-31 DIAGNOSIS — M9902 Segmental and somatic dysfunction of thoracic region: Secondary | ICD-10-CM | POA: Diagnosis not present

## 2021-02-04 DIAGNOSIS — L570 Actinic keratosis: Secondary | ICD-10-CM | POA: Diagnosis not present

## 2021-02-04 DIAGNOSIS — D485 Neoplasm of uncertain behavior of skin: Secondary | ICD-10-CM | POA: Diagnosis not present

## 2021-02-04 DIAGNOSIS — L820 Inflamed seborrheic keratosis: Secondary | ICD-10-CM | POA: Diagnosis not present

## 2021-02-07 DIAGNOSIS — M9903 Segmental and somatic dysfunction of lumbar region: Secondary | ICD-10-CM | POA: Diagnosis not present

## 2021-02-07 DIAGNOSIS — M62830 Muscle spasm of back: Secondary | ICD-10-CM | POA: Diagnosis not present

## 2021-02-07 DIAGNOSIS — M546 Pain in thoracic spine: Secondary | ICD-10-CM | POA: Diagnosis not present

## 2021-02-07 DIAGNOSIS — M9902 Segmental and somatic dysfunction of thoracic region: Secondary | ICD-10-CM | POA: Diagnosis not present

## 2021-02-21 DIAGNOSIS — M62830 Muscle spasm of back: Secondary | ICD-10-CM | POA: Diagnosis not present

## 2021-02-21 DIAGNOSIS — M9902 Segmental and somatic dysfunction of thoracic region: Secondary | ICD-10-CM | POA: Diagnosis not present

## 2021-02-21 DIAGNOSIS — M546 Pain in thoracic spine: Secondary | ICD-10-CM | POA: Diagnosis not present

## 2021-02-21 DIAGNOSIS — M9903 Segmental and somatic dysfunction of lumbar region: Secondary | ICD-10-CM | POA: Diagnosis not present

## 2021-02-25 DIAGNOSIS — B079 Viral wart, unspecified: Secondary | ICD-10-CM | POA: Diagnosis not present

## 2021-04-09 DIAGNOSIS — R7301 Impaired fasting glucose: Secondary | ICD-10-CM | POA: Diagnosis not present

## 2021-04-09 DIAGNOSIS — E782 Mixed hyperlipidemia: Secondary | ICD-10-CM | POA: Diagnosis not present

## 2021-04-09 DIAGNOSIS — Z0000 Encounter for general adult medical examination without abnormal findings: Secondary | ICD-10-CM | POA: Diagnosis not present

## 2021-04-15 DIAGNOSIS — Z0001 Encounter for general adult medical examination with abnormal findings: Secondary | ICD-10-CM | POA: Diagnosis not present

## 2021-05-26 ENCOUNTER — Ambulatory Visit
Admission: EM | Admit: 2021-05-26 | Discharge: 2021-05-26 | Disposition: A | Discharge status: Home / Self Care | Payer: BC Managed Care – PPO | Attending: Nurse Practitioner | Admitting: Nurse Practitioner

## 2021-05-26 DIAGNOSIS — Z23 Encounter for immunization: Secondary | ICD-10-CM | POA: Diagnosis not present

## 2021-05-26 DIAGNOSIS — S61212A Laceration without foreign body of right middle finger without damage to nail, initial encounter: Secondary | ICD-10-CM

## 2021-05-26 MED ORDER — TETANUS-DIPHTH-ACELL PERTUSSIS 5-2.5-18.5 LF-MCG/0.5 IM SUSY
0.5000 mL | PREFILLED_SYRINGE | Freq: Once | INTRAMUSCULAR | Status: AC
  Administered 2021-05-26: 0.5 mL via INTRAMUSCULAR

## 2021-05-26 NOTE — ED Provider Notes (Signed)
?[facility] ? ? ? ?CSN: [id number] ?Arrival date & time: 05/26/21  1939 ? ? ?  ? ?History   ?Chief Complaint ?Chief Complaint  ?Patient presents with  ? Laceration  ? ? ?HPI ?Kurt Rosales is a 56 y.o. male.  ? ?The patient is a 56 year old male who presents with a laceration to the right middle finger.  Injury occurred within the past hour as he was installing a backslash and cut tip of his finger on a piece of tin.  Bleeding was controlled upon arrival.  He denies any numbness, tingling, or pain, or decreased range of motion at this time.  He does not recall when his last tetanus was done. ? ?The history is provided by the patient.  ? ?Past Medical History:  ?Diagnosis Date  ? Heart murmur after rheumatic heart disease   ? had Rheumatic fever as a child  ? History of kidney stones   ? ? ?Patient Active Problem List  ? Diagnosis Date Noted  ? Special screening for malignant neoplasms, colon 05/14/2017  ? ? ?Past Surgical History:  ?Procedure Laterality Date  ? COLONOSCOPY N/A 08/25/2017  ? Procedure: COLONOSCOPY;  Surgeon: Rogene Houston, MD;  Location: [facility];  Service: Endoscopy;  Laterality: N/A;  830  ? POLYPECTOMY  08/25/2017  ? Procedure: POLYPECTOMY;  Surgeon: Rogene Houston, MD;  Location: [facility];  Service: Endoscopy;;  colon  ? ? ? ? ? ?Home Medications   ? ?Prior to Admission medications   ?Medication Sig Start Date End Date Taking? Authorizing Provider  ?cetirizine (ZYRTEC) 10 MG tablet Take 10 mg by mouth daily as needed for allergies.    [provider]  ?fluticasone (FLONASE) 50 MCG/ACT nasal spray Place 1 spray into both nostrils daily as needed for allergies or rhinitis.    [provider]  ?ibuprofen (ADVIL,MOTRIN) 200 MG tablet Take 400-600 mg by mouth daily as needed for headache or moderate pain.    [provider]  ?MELATONIN PO Take 1 tablet by mouth at bedtime as needed (sleep).    [provider]  ? ? ?Family History ?History  reviewed. No pertinent family history. ? ?Social History ?Social History  ? ?Tobacco Use  ? Smoking status: Former  ? Smokeless tobacco: Former  ?Vaping Use  ? Vaping Use: Never used  ?Substance Use Topics  ? Alcohol use: Yes  ?  Comment: occ  ? Drug use: No  ? ? ? ?Allergies   ?Patient has no known allergies. ? ? ?Review of Systems ?Review of Systems  ?Constitutional: Negative.   ?Skin:   ?     laceration  ?Psychiatric/Behavioral: Negative.    ? ? ?Physical Exam ?Triage Vital Signs ?ED Triage Vitals [05/26/21 1953]  ?Enc Vitals Group  ?   BP   ?   Pulse   ?   Resp   ?   Temp   ?   Temp src   ?   SpO2   ?   Weight   ?   Height   ?   Head Circumference   ?   Peak Flow   ?   Pain Score 3  ?   Pain Loc   ?   Pain Edu?   ?   Excl. in [place]?   ? ?No data found. ? ?Updated Vital Signs ?There were no vitals taken for this visit. ? ?Visual Acuity ?Right Eye Distance:   ?Left Eye Distance:   ?Bilateral  Distance:   ? ?Right Eye Near:   ?Left Eye Near:    ?Bilateral Near:    ? ?Physical Exam ?Vitals and nursing note reviewed.  ?Constitutional:   ?   General: He is not in acute distress. ?   Appearance: Normal appearance.  ?Musculoskeletal:     ?   General: Normal range of motion.  ?Skin: ?   General: Skin is warm and dry.  ?   Capillary Refill: Capillary refill takes less than 2 seconds.  ?   Comments: C-shaped superficial laceration to the distal tip of the middle right finger.  Skin appears as a flap, edges are well approximated.  Bleeding is controlled at this time.  There is no swelling, numbness, or tingling at this time.  ?Neurological:  ?   General: No focal deficit present.  ?   Mental Status: He is alert and oriented to person, place, and time.  ?Psychiatric:     ?   Mood and Affect: Mood normal.     ?   Behavior: Behavior normal.  ? ? ? ?UC Treatments / Results  ?Labs ?(all labs ordered are listed, but only abnormal results are displayed) ?Labs Reviewed - No data to display ? ?EKG ? ? ?Radiology ?No results  found. ? ?Procedures ?Laceration Repair ? ?Date/Time: 05/26/2021 8:11 PM ?Performed by: Tish Men, NP ?Authorized by: Tish Men, NP  ? ?Consent:  ?  Consent obtained:  Verbal ?  Consent given by:  Patient ?  Risks discussed:  Infection and pain ?  Alternatives discussed:  No treatment ?Universal protocol:  ?  Procedure explained and questions answered to patient or proxy's satisfaction: yes   ?  Patient identity confirmed:  Verbally with patient and arm band ?Anesthesia:  ?  Anesthesia method:  None ?Laceration details:  ?  Location:  Finger ?  Finger location:  R long finger ?  Wound length (cm): 2. ?  Laceration depth: superficial. ?Treatment:  ?  Area cleansed with:  Soap and water ?  Amount of cleaning:  Standard ?  Irrigation solution:  Tap water ?  Visualized foreign bodies/material removed: yes   ?  Debridement:  None ?Skin repair:  ?  Repair method:  Tissue adhesive ?Approximation:  ?  Approximation:  Close ?Post-procedure details:  ?  Dressing:  Tube gauze ?  Procedure completion:  Tolerated (including critical care time) ? ?Medications Ordered in UC ?Medications  ?Tdap (BOOSTRIX) injection 0.5 mL (0.5 mLs Intramuscular Given 05/26/21 2005)  ? ? ?Initial Impression / Assessment and Plan / UC Course  ?I have reviewed the triage vital signs and the nursing notes. ? ?Pertinent labs & imaging results that were available during my care of the patient were reviewed by me and considered in my medical decision making (see chart for details). ? ?The patient is a 56 year old male who presents with a laceration to the third middle finger.  Injury occurred within an hour prior to his arrival.  The patient has a C-shaped laceration that appears as a skin flap to the distal tip of his third middle finger.  Bleeding was controlled upon arrival.  Edges are well approximated.  Laceration was repaired with Dermabond and Steri-Strips.  Area was cleaned prior to application of the Dermabond.  Bleeding  was also controlled at this time.  Patient tolerated the procedure well.  Area was wrapped with a gauze and Coban dressing.  Patient was provided wound care instructions.  Patient advised to keep the  area clean and dry. Indications of infection were discussed with the patient.  Patient advised that if symptoms to occur to follow-up as soon as possible.  Follow-up for any concerns. ?Final Clinical Impressions(s) / UC Diagnoses  ? ?Final diagnoses:  ?Laceration of right middle finger without foreign body without damage to nail, initial encounter  ? ? ? ?Discharge Instructions   ? ?  ?Your laceration was repaired with Dermabond adhesive and Steri-Strips. ?Keep the area clean and dry. ?Cleanse the area with warm water. ?Your tetanus shot has been updated, it will be good for the next 10 years. ?Monitor the right middle finger for signs of infection to include fever, chills, foul-smelling drainage, swelling, worsening pain or other concerns. ?Do not remove the glue, it will fall off on its own. ?Follow-up as needed. ? ? ? ? ?ED Prescriptions   ?None ?  ? ?PDMP not reviewed this encounter. ?  ?Tish Men, NP ?05/27/21 0802 ? ?

## 2021-05-26 NOTE — ED Triage Notes (Signed)
Pt presents with laceration to right middle finger  that happened while installing backsplash  ?

## 2021-05-26 NOTE — Discharge Instructions (Signed)
Your laceration was repaired with Dermabond adhesive and Steri-Strips. ?Keep the area clean and dry. ?Cleanse the area with warm water. ?Your tetanus shot has been updated, it will be good for the next 10 years. ?Monitor the right middle finger for signs of infection to include fever, chills, foul-smelling drainage, swelling, worsening pain or other concerns. ?Do not remove the glue, it will fall off on its own. ?Follow-up as needed. ?

## 2021-10-15 DIAGNOSIS — E782 Mixed hyperlipidemia: Secondary | ICD-10-CM | POA: Diagnosis not present

## 2021-10-21 DIAGNOSIS — E782 Mixed hyperlipidemia: Secondary | ICD-10-CM | POA: Diagnosis not present

## 2021-10-21 DIAGNOSIS — R7301 Impaired fasting glucose: Secondary | ICD-10-CM | POA: Diagnosis not present

## 2021-10-21 DIAGNOSIS — Z6825 Body mass index (BMI) 25.0-25.9, adult: Secondary | ICD-10-CM | POA: Diagnosis not present

## 2021-10-21 DIAGNOSIS — R030 Elevated blood-pressure reading, without diagnosis of hypertension: Secondary | ICD-10-CM | POA: Diagnosis not present

## 2022-02-10 DIAGNOSIS — D239 Other benign neoplasm of skin, unspecified: Secondary | ICD-10-CM | POA: Diagnosis not present

## 2022-02-10 DIAGNOSIS — L570 Actinic keratosis: Secondary | ICD-10-CM | POA: Diagnosis not present

## 2022-03-25 DIAGNOSIS — M9903 Segmental and somatic dysfunction of lumbar region: Secondary | ICD-10-CM | POA: Diagnosis not present

## 2022-03-25 DIAGNOSIS — M62830 Muscle spasm of back: Secondary | ICD-10-CM | POA: Diagnosis not present

## 2022-03-25 DIAGNOSIS — M9902 Segmental and somatic dysfunction of thoracic region: Secondary | ICD-10-CM | POA: Diagnosis not present

## 2022-03-25 DIAGNOSIS — M546 Pain in thoracic spine: Secondary | ICD-10-CM | POA: Diagnosis not present

## 2022-04-01 DIAGNOSIS — M9902 Segmental and somatic dysfunction of thoracic region: Secondary | ICD-10-CM | POA: Diagnosis not present

## 2022-04-01 DIAGNOSIS — M546 Pain in thoracic spine: Secondary | ICD-10-CM | POA: Diagnosis not present

## 2022-04-01 DIAGNOSIS — M9903 Segmental and somatic dysfunction of lumbar region: Secondary | ICD-10-CM | POA: Diagnosis not present

## 2022-04-01 DIAGNOSIS — M62830 Muscle spasm of back: Secondary | ICD-10-CM | POA: Diagnosis not present

## 2022-04-16 DIAGNOSIS — R7301 Impaired fasting glucose: Secondary | ICD-10-CM | POA: Diagnosis not present

## 2022-04-16 DIAGNOSIS — R030 Elevated blood-pressure reading, without diagnosis of hypertension: Secondary | ICD-10-CM | POA: Diagnosis not present

## 2022-04-16 DIAGNOSIS — E782 Mixed hyperlipidemia: Secondary | ICD-10-CM | POA: Diagnosis not present

## 2022-04-29 DIAGNOSIS — Z713 Dietary counseling and surveillance: Secondary | ICD-10-CM | POA: Diagnosis not present

## 2022-04-29 DIAGNOSIS — Z125 Encounter for screening for malignant neoplasm of prostate: Secondary | ICD-10-CM | POA: Diagnosis not present

## 2022-04-29 DIAGNOSIS — R7301 Impaired fasting glucose: Secondary | ICD-10-CM | POA: Diagnosis not present

## 2022-04-29 DIAGNOSIS — E782 Mixed hyperlipidemia: Secondary | ICD-10-CM | POA: Diagnosis not present

## 2022-04-29 DIAGNOSIS — R030 Elevated blood-pressure reading, without diagnosis of hypertension: Secondary | ICD-10-CM | POA: Diagnosis not present

## 2022-08-10 ENCOUNTER — Encounter (INDEPENDENT_AMBULATORY_CARE_PROVIDER_SITE_OTHER): Payer: Self-pay | Admitting: *Deleted

## 2022-09-11 ENCOUNTER — Telehealth (INDEPENDENT_AMBULATORY_CARE_PROVIDER_SITE_OTHER): Payer: Self-pay | Admitting: *Deleted

## 2022-09-11 NOTE — Telephone Encounter (Signed)
[id number]  Best Phone Number: [phone]  Reason for the colonoscopy hx polyps - 5 yr TCS recall  Has patient had this procedure before?  Yes, 08/2017  If so, when, by whom and where?    Is there a family history of colon cancer?  no  Who?  What age when diagnosed?    Is patient diabetic? If yes, Type 1 or Type 2   no      Does patient have prosthetic heart valve or mechanical valve?  no  Do you have a pacemaker/defibrillator?  no  Has patient ever had endocarditis/atrial fibrillation? no  Has patient had joint replacement within last 12 months?  no  Is patient constipated or do they take laxatives? no  Does patient have a history of alcohol/drug use?  no  Does patient use oxygen? no  Have you had a stroke/heart attack last 6 mths? no  Do you take medicine for weight loss?  no  For male patients,: have you had a hysterectomy                       are you post menopausal                       do you still have your menstrual cycle   Do you take any blood-thinning medications such as: (aspirin, warfarin, Plavix, Aggrenox)  no  If yes we need the name, milligram, dosage and who is prescribing doctor   Medications: none  Allergies: nkda  Pharmacy: [facility]

## 2022-09-11 NOTE — Telephone Encounter (Signed)
Ok to schedule.  Room 1  Thanks,  Vista Lawman, MD Gastroenterology and Hepatology [facility]

## 2022-09-24 NOTE — Telephone Encounter (Signed)
Will call once get OCt schedule

## 2022-10-15 NOTE — Telephone Encounter (Signed)
LMOVM to call back to schedule

## 2022-10-16 ENCOUNTER — Encounter: Payer: Self-pay | Admitting: *Deleted

## 2022-10-16 ENCOUNTER — Other Ambulatory Visit: Payer: Self-pay | Admitting: *Deleted

## 2022-10-16 MED ORDER — PEG 3350-KCL-NA BICARB-NACL 420 G PO SOLR: 4000.0000 mL | Freq: Once | ORAL | 0 refills | Status: AC

## 2022-10-16 NOTE — Telephone Encounter (Signed)
Pt has been scheduled for 11/17/22. Instructions mailed and prep sent to the pharmacy

## 2022-10-16 NOTE — Telephone Encounter (Signed)
Pt left vm returning call  LMTRC 

## 2022-10-20 NOTE — Telephone Encounter (Signed)
Questionnaire from recall, no referral needed  

## 2022-11-09 DIAGNOSIS — M9903 Segmental and somatic dysfunction of lumbar region: Secondary | ICD-10-CM | POA: Diagnosis not present

## 2022-11-09 DIAGNOSIS — M62830 Muscle spasm of back: Secondary | ICD-10-CM | POA: Diagnosis not present

## 2022-11-09 DIAGNOSIS — M9902 Segmental and somatic dysfunction of thoracic region: Secondary | ICD-10-CM | POA: Diagnosis not present

## 2022-11-09 DIAGNOSIS — M546 Pain in thoracic spine: Secondary | ICD-10-CM | POA: Diagnosis not present

## 2022-11-17 ENCOUNTER — Ambulatory Visit (HOSPITAL_COMMUNITY): Payer: BC Managed Care – PPO | Admitting: Certified Registered"

## 2022-11-17 ENCOUNTER — Other Ambulatory Visit: Payer: Self-pay

## 2022-11-17 ENCOUNTER — Ambulatory Visit (HOSPITAL_COMMUNITY)
Admission: RE | Admit: 2022-11-17 | Discharge: 2022-11-17 | Disposition: A | Discharge status: Home / Self Care | Payer: BC Managed Care – PPO | Attending: Gastroenterology | Admitting: Gastroenterology

## 2022-11-17 ENCOUNTER — Encounter (HOSPITAL_COMMUNITY): Payer: Self-pay

## 2022-11-17 ENCOUNTER — Encounter (INDEPENDENT_AMBULATORY_CARE_PROVIDER_SITE_OTHER): Payer: Self-pay | Admitting: *Deleted

## 2022-11-17 ENCOUNTER — Encounter (HOSPITAL_COMMUNITY)
Admission: RE | Disposition: A | Discharge status: Home / Self Care | Payer: Self-pay | Source: Home / Self Care | Attending: Gastroenterology

## 2022-11-17 DIAGNOSIS — K644 Residual hemorrhoidal skin tags: Secondary | ICD-10-CM | POA: Diagnosis not present

## 2022-11-17 DIAGNOSIS — R011 Cardiac murmur, unspecified: Secondary | ICD-10-CM | POA: Diagnosis not present

## 2022-11-17 DIAGNOSIS — Z87891 Personal history of nicotine dependence: Secondary | ICD-10-CM | POA: Insufficient documentation

## 2022-11-17 DIAGNOSIS — Z1211 Encounter for screening for malignant neoplasm of colon: Secondary | ICD-10-CM | POA: Diagnosis not present

## 2022-11-17 DIAGNOSIS — K5520 Angiodysplasia of colon without hemorrhage: Secondary | ICD-10-CM | POA: Insufficient documentation

## 2022-11-17 DIAGNOSIS — D125 Benign neoplasm of sigmoid colon: Secondary | ICD-10-CM | POA: Diagnosis not present

## 2022-11-17 DIAGNOSIS — D123 Benign neoplasm of transverse colon: Secondary | ICD-10-CM | POA: Insufficient documentation

## 2022-11-17 DIAGNOSIS — K648 Other hemorrhoids: Secondary | ICD-10-CM | POA: Diagnosis not present

## 2022-11-17 DIAGNOSIS — Q2733 Arteriovenous malformation of digestive system vessel: Secondary | ICD-10-CM | POA: Diagnosis not present

## 2022-11-17 DIAGNOSIS — K5730 Diverticulosis of large intestine without perforation or abscess without bleeding: Secondary | ICD-10-CM | POA: Insufficient documentation

## 2022-11-17 DIAGNOSIS — K635 Polyp of colon: Secondary | ICD-10-CM | POA: Diagnosis not present

## 2022-11-17 DIAGNOSIS — Z860100 Personal history of colon polyps, unspecified: Secondary | ICD-10-CM | POA: Diagnosis not present

## 2022-11-17 HISTORY — PX: POLYPECTOMY: SHX5525

## 2022-11-17 HISTORY — PX: HOT HEMOSTASIS: SHX5433

## 2022-11-17 HISTORY — PX: COLONOSCOPY WITH PROPOFOL: SHX5780

## 2022-11-17 LAB — HM COLONOSCOPY

## 2022-11-17 SURGERY — COLONOSCOPY WITH PROPOFOL
Anesthesia: General

## 2022-11-17 MED ORDER — LACTATED RINGERS IV SOLN: INTRAVENOUS | Status: DC | PRN

## 2022-11-17 MED ORDER — PROPOFOL 500 MG/50ML IV EMUL
INTRAVENOUS | Status: DC | PRN
  Administered 2022-11-17: 150 ug/kg/min via INTRAVENOUS

## 2022-11-17 MED ORDER — SODIUM CHLORIDE 0.9% FLUSH: 10.0000 mL | Freq: Two times a day (BID) | INTRAVENOUS | Status: DC

## 2022-11-17 MED ORDER — LIDOCAINE HCL (CARDIAC) PF 100 MG/5ML IV SOSY
PREFILLED_SYRINGE | INTRAVENOUS | Status: DC | PRN
  Administered 2022-11-17: 50 mg via INTRAVENOUS

## 2022-11-17 MED ORDER — PROPOFOL 10 MG/ML IV BOLUS
INTRAVENOUS | Status: DC | PRN
  Administered 2022-11-17: 30 mg via INTRAVENOUS
  Administered 2022-11-17: 100 mg via INTRAVENOUS
  Administered 2022-11-17 (×2): 50 mg via INTRAVENOUS

## 2022-11-17 NOTE — Anesthesia Preprocedure Evaluation (Signed)
Anesthesia Evaluation  Patient identified by MRN, date of birth, ID band Patient awake    Reviewed: Allergy & Precautions, H&P , NPO status , Patient's Chart, lab work & pertinent test results, reviewed documented beta blocker date and time   Airway Mallampati: II  TM Distance: >3 FB Neck ROM: full    Dental no notable dental hx.    Pulmonary neg pulmonary ROS, former smoker   Pulmonary exam normal breath sounds clear to auscultation       Cardiovascular Exercise Tolerance: Good negative cardio ROS + Valvular Problems/Murmurs  Rhythm:regular Rate:Normal     Neuro/Psych negative neurological ROS  negative psych ROS   GI/Hepatic negative GI ROS, Neg liver ROS,,,  Endo/Other  negative endocrine ROS    Renal/GU negative Renal ROS  negative genitourinary   Musculoskeletal   Abdominal   Peds  Hematology negative hematology ROS (+)   Anesthesia Other Findings   Reproductive/Obstetrics negative OB ROS                             Anesthesia Physical Anesthesia Plan  ASA: 2  Anesthesia Plan: General   Post-op Pain Management:    Induction:   PONV Risk Score and Plan: Propofol infusion  Airway Management Planned:   Additional Equipment:   Intra-op Plan:   Post-operative Plan:   Informed Consent: I have reviewed the patients History and Physical, chart, labs and discussed the procedure including the risks, benefits and alternatives for the proposed anesthesia with the patient or authorized representative who has indicated his/her understanding and acceptance.     Dental Advisory Given  Plan Discussed with: CRNA  Anesthesia Plan Comments:        Anesthesia Quick Evaluation

## 2022-11-17 NOTE — H&P (Addendum)
  Primary Care Physician:  Benita Stabile, MD Primary Gastroenterologist:  Dr. Tasia Catchings  Pre-Procedure History & Physical: HPI:  Kurt Rosales is a 57 y.o. male is here for a colonoscopy for colon cancer screening purposes.  Patient denies any family history of colorectal cancer.  No melena or hematochezia.  No abdominal pain or unintentional weight loss.  No change in bowel habits.  Overall feels well from a GI standpoint.  Colonoscopy 2019  3 small polyps removed and there tubular adenomas.  Past Medical History:  Diagnosis Date   Heart murmur after rheumatic heart disease    had Rheumatic fever as a child   History of kidney stones     Past Surgical History:  Procedure Laterality Date   COLONOSCOPY N/A 08/25/2017   Procedure: COLONOSCOPY;  Surgeon: Malissa Hippo, MD;  Location: [facility];  Service: Endoscopy;  Laterality: N/A;  830   POLYPECTOMY  08/25/2017   Procedure: POLYPECTOMY;  Surgeon: Malissa Hippo, MD;  Location: [facility];  Service: Endoscopy;;  colon    Prior to Admission medications   Medication Sig Start Date End Date Taking? Authorizing Provider  ibuprofen (ADVIL,MOTRIN) 200 MG tablet Take 400-600 mg by mouth daily as needed for headache or moderate pain.    [provider]    Allergies as of 10/16/2022   (No Known Allergies)    No family history on file.  Social History   Socioeconomic History   Marital status: Married    Spouse name: Not on file   Number of children: Not on file   Years of education: Not on file   Highest education level: Not on file  Occupational History   Not on file  Tobacco Use   Smoking status: Former   Smokeless tobacco: Former  Advertising account planner   Vaping status: Never Used  Substance and Sexual Activity   Alcohol use: Yes    Comment: occ   Drug use: No   Sexual activity: Not on file  Other Topics Concern   Not on file  Social History Narrative   Not on file   Social Determinants of Health   Financial  Resource Strain: Not on file  Food Insecurity: Not on file  Transportation Needs: Not on file  Physical Activity: Not on file  Stress: Not on file  Social Connections: Not on file  Intimate Partner Violence: Not on file    Review of Systems: See HPI, otherwise negative ROS  Physical Exam: Vital signs in last 24 hours:     General:   Alert,  Well-developed, well-nourished, pleasant and cooperative in NAD Head:  Normocephalic and atraumatic. Eyes:  Sclera clear, no icterus.   Conjunctiva pink. Ears:  Normal auditory acuity. Nose:  No deformity, discharge,  or lesions. Msk:  Symmetrical without gross deformities. Normal posture. Extremities:  Without clubbing or edema. Neurologic:  Alert and  oriented x4;  grossly normal neurologically. Skin:  Intact without significant lesions or rashes. Psych:  Alert and cooperative. Normal mood and affect.  Impression/Plan: Kurt Rosales is here for a colonoscopy to be performed for colon cancer screening purposes.  Addendum : after procedure patient does report occasional fresh blood upon rectum  The risks of the procedure including infection, bleed, or perforation as well as benefits, limitations, alternatives and imponderables have been reviewed with the patient. Questions have been answered. All parties agreeable.

## 2022-11-17 NOTE — Discharge Instructions (Signed)

## 2022-11-17 NOTE — Op Note (Addendum)
[facility] Patient Name: Kurt Rosales Procedure Date: 11/17/2022 11:03 AM MRN: [id number] Date of Birth: 04/01/1965 Attending MD: Sanjuan Dame , MD, [id number] CSN: [id number] Age: 57 Admit Type: Outpatient Procedure:                Colonoscopy Indications:              Screening for colorectal malignant neoplasm Providers:                Sanjuan Dame, MD, Edrick Kins, RN, Elinor Parkinson Referring MD:              Medicines:                Monitored Anesthesia Care Complications:            No immediate complications. Estimated Blood Loss:     Estimated blood loss: none. Procedure:                Pre-Anesthesia Assessment:                           - Prior to the procedure, a History and Physical                            was performed, and patient medications and                            allergies were reviewed. The patient's tolerance of                            previous anesthesia was also reviewed. The risks                            and benefits of the procedure and the sedation                            options and risks were discussed with the patient.                            All questions were answered, and informed consent                            was obtained. Prior Anticoagulants: The patient has                            taken no anticoagulant or antiplatelet agents. ASA                            Grade Assessment: II - A patient with mild systemic                            disease. After reviewing the risks and benefits,  the patient was deemed in satisfactory condition to                            undergo the procedure.                           After obtaining informed consent, the colonoscope                            was passed under direct vision. Throughout the                            procedure, the patient's blood pressure, pulse, and                            oxygen  saturations were monitored continuously. The                            [id number]) scope was introduced through the                            anus and advanced to the the cecum, identified by                            appendiceal orifice and ileocecal valve. The                            colonoscopy was performed without difficulty. The                            patient tolerated the procedure well. The quality                            of the bowel preparation was evaluated using the                            BBPS [facility] Bowel Preparation Scale) with scores                            of: Right Colon = 3, Transverse Colon = 3 and Left                            Colon = 3 (entire mucosa seen well with no residual                            staining, small fragments of stool or opaque                            liquid). The total BBPS score equals 9. The                            ileocecal valve, appendiceal orifice, and rectum  were photographed. Scope In: 11:56:11 AM Scope Out: 12:22:29 PM Scope Withdrawal Time: 0 hours 23 minutes 54 seconds  Total Procedure Duration: 0 hours 26 minutes 18 seconds  Findings:      The perianal and digital rectal examinations were normal.      A single medium-sized angioectasia without bleeding was found in the       cecum. Coagulation for bleeding prevention using argon plasma at 0.3       liters/minute and 20 watts was successful.      Three sessile polyps were found in the sigmoid colon and transverse       colon. The polyps were 3 to 5 mm in size. These polyps were removed with       a cold snare. Resection and retrieval were complete.      A few diverticula were found in the left colon.      Non-bleeding external and internal hemorrhoids were found during       retroflexion. The hemorrhoids were small. Impression:               - A single non-bleeding colonic angioectasia.                            Treated  with argon plasma coagulation (APC).                           - Three 3 to 5 mm polyps in the sigmoid colon and                            in the transverse colon, removed with a cold snare.                            Resected and retrieved.                           - Diverticulosis in the left colon.                           - Non-bleeding external and internal hemorrhoids. Moderate Sedation:      Per Anesthesia Care Recommendation:           - Patient has a contact number available for                            emergencies. The signs and symptoms of potential                            delayed complications were discussed with the                            patient. Return to normal activities tomorrow.                            Written discharge instructions were provided to the                            patient.                           -  Resume previous diet.                           - Continue present medications.                           - Await pathology results.                           - Repeat colonoscopy in 5 years for surveillance.                           - Return to primary care physician as previously                            scheduled. Procedure Code(s):        --- Professional ---                           [phone], 59, Colonoscopy, flexible; with control of                            bleeding, any method                           45385, Colonoscopy, flexible; with removal of                            tumor(s), polyp(s), or other lesion(s) by snare                            technique Diagnosis Code(s):        --- Professional ---                           Z12.11, Encounter for screening for malignant                            neoplasm of colon                           K55.20, Angiodysplasia of colon without hemorrhage                           D12.5, Benign neoplasm of sigmoid colon                           D12.3, Benign neoplasm of transverse colon  (hepatic                            flexure or splenic flexure)                           K64.8, Other hemorrhoids                           K57.30, Diverticulosis of large intestine without  perforation or abscess without bleeding CPT copyright 2022 American Medical Association. All rights reserved. The codes documented in this report are preliminary and upon coder review may  be revised to meet current compliance requirements. Sanjuan Dame, MD Sanjuan Dame, MD 11/17/2022 12:32:40 PM This report has been signed electronically. Number of Addenda: 0

## 2022-11-17 NOTE — Transfer of Care (Signed)
Immediate Anesthesia Transfer of Care Note  Patient: Kurt Rosales  Procedure(s) Performed: COLONOSCOPY WITH PROPOFOL HOT HEMOSTASIS (ARGON PLASMA COAGULATION/BICAP) POLYPECTOMY  Patient Location: Endoscopy Unit  Anesthesia Type:General  Level of Consciousness: awake  Airway & Oxygen Therapy: Patient Spontanous Breathing  Post-op Assessment: Report given to RN and Post -op Vital signs reviewed and stable  Post vital signs: Reviewed and stable  Last Vitals:  Vitals Value Taken Time  BP    Temp    Pulse    Resp    SpO2      Last Pain:  Vitals:   11/17/22 1152  TempSrc:   PainSc: 0-No pain      Patients Stated Pain Goal: 7 (11/17/22 1113)  Complications: No notable events documented.

## 2022-11-17 NOTE — Anesthesia Procedure Notes (Signed)
Date/Time: 11/17/2022 11:53 AM  Performed by: Julian Reil, CRNAPre-anesthesia Checklist: Patient identified, Emergency Drugs available, Suction available and Patient being monitored Patient Re-evaluated:Patient Re-evaluated prior to induction Oxygen Delivery Method: Nasal cannula Induction Type: IV induction Placement Confirmation: positive ETCO2

## 2022-11-18 DIAGNOSIS — M546 Pain in thoracic spine: Secondary | ICD-10-CM | POA: Diagnosis not present

## 2022-11-18 DIAGNOSIS — M9902 Segmental and somatic dysfunction of thoracic region: Secondary | ICD-10-CM | POA: Diagnosis not present

## 2022-11-18 DIAGNOSIS — M9903 Segmental and somatic dysfunction of lumbar region: Secondary | ICD-10-CM | POA: Diagnosis not present

## 2022-11-18 DIAGNOSIS — M62830 Muscle spasm of back: Secondary | ICD-10-CM | POA: Diagnosis not present

## 2022-11-18 LAB — SURGICAL PATHOLOGY

## 2022-11-20 DIAGNOSIS — Z125 Encounter for screening for malignant neoplasm of prostate: Secondary | ICD-10-CM | POA: Diagnosis not present

## 2022-11-20 DIAGNOSIS — E782 Mixed hyperlipidemia: Secondary | ICD-10-CM | POA: Diagnosis not present

## 2022-11-20 DIAGNOSIS — R7301 Impaired fasting glucose: Secondary | ICD-10-CM | POA: Diagnosis not present

## 2022-11-23 NOTE — Anesthesia Postprocedure Evaluation (Signed)
Anesthesia Post Note  Patient: Rudolf Blizard  Procedure(s) Performed: COLONOSCOPY WITH PROPOFOL HOT HEMOSTASIS (ARGON PLASMA COAGULATION/BICAP) POLYPECTOMY  Patient location during evaluation: Phase II Anesthesia Type: General Level of consciousness: awake Pain management: pain level controlled Vital Signs Assessment: post-procedure vital signs reviewed and stable Respiratory status: spontaneous breathing and respiratory function stable Cardiovascular status: blood pressure returned to baseline and stable Postop Assessment: no headache and no apparent nausea or vomiting Anesthetic complications: no Comments: Late entry   No notable events documented.   Last Vitals:  Vitals:   11/17/22 1120 11/17/22 1225  BP: (!) 147/85 117/73  Pulse: (!) 58 72  Resp: 13 13  Temp: 36.5 C 36.4 C  SpO2: 98% 97%    Last Pain:  Vitals:   11/17/22 1225  TempSrc: Oral  PainSc: 0-No pain                 Windell Norfolk

## 2022-11-23 NOTE — Progress Notes (Signed)
I reviewed the pathology results. Ann, can you send her a letter with the findings as described below please? Repeat colonoscopy in 7 years  Thanks,  Vista Lawman, MD Gastroenterology and Hepatology [facility]  ---------------------------------------------------------------------------------------------  [facility] [address], [address], [place], [place] [address] Phone:  [phone]   11/23/22 [name], [place]   Dear Kurt Rosales,  I am writing to inform you that the biopsies taken during your recent endoscopic examination showed:  I am writing to let you know the results of your recent colonoscopy.  You had a total of 3 polyps removed. The pathology came back as "tubular adenoma and hyperplastic ." These findings are NOT cancer, but had the polyps (tubular adenoma) remained in your colon, they could have turned into cancer.  Given these findings, it is recommended that your next colonoscopy be performed in 7 years.  Please call us at [phone] if you have persistent problems or have questions about your condition that have not been fully answered at this time.  Sincerely,  Vista Lawman, MD Gastroenterology and Hepatology

## 2022-11-24 ENCOUNTER — Encounter (INDEPENDENT_AMBULATORY_CARE_PROVIDER_SITE_OTHER): Payer: Self-pay | Admitting: *Deleted

## 2022-11-24 ENCOUNTER — Encounter (HOSPITAL_COMMUNITY): Payer: Self-pay | Admitting: Gastroenterology

## 2022-11-30 DIAGNOSIS — M9903 Segmental and somatic dysfunction of lumbar region: Secondary | ICD-10-CM | POA: Diagnosis not present

## 2022-11-30 DIAGNOSIS — M546 Pain in thoracic spine: Secondary | ICD-10-CM | POA: Diagnosis not present

## 2022-11-30 DIAGNOSIS — M9902 Segmental and somatic dysfunction of thoracic region: Secondary | ICD-10-CM | POA: Diagnosis not present

## 2022-11-30 DIAGNOSIS — M62830 Muscle spasm of back: Secondary | ICD-10-CM | POA: Diagnosis not present

## 2022-12-02 ENCOUNTER — Other Ambulatory Visit (HOSPITAL_COMMUNITY): Payer: Self-pay | Admitting: Family Medicine

## 2022-12-02 DIAGNOSIS — Z0000 Encounter for general adult medical examination without abnormal findings: Secondary | ICD-10-CM | POA: Diagnosis not present

## 2022-12-02 DIAGNOSIS — E782 Mixed hyperlipidemia: Secondary | ICD-10-CM

## 2022-12-02 DIAGNOSIS — Z0001 Encounter for general adult medical examination with abnormal findings: Secondary | ICD-10-CM | POA: Diagnosis not present

## 2022-12-02 DIAGNOSIS — R7301 Impaired fasting glucose: Secondary | ICD-10-CM | POA: Diagnosis not present

## 2022-12-02 DIAGNOSIS — R030 Elevated blood-pressure reading, without diagnosis of hypertension: Secondary | ICD-10-CM | POA: Diagnosis not present

## 2022-12-09 DIAGNOSIS — M9903 Segmental and somatic dysfunction of lumbar region: Secondary | ICD-10-CM | POA: Diagnosis not present

## 2022-12-09 DIAGNOSIS — M9902 Segmental and somatic dysfunction of thoracic region: Secondary | ICD-10-CM | POA: Diagnosis not present

## 2022-12-09 DIAGNOSIS — M62830 Muscle spasm of back: Secondary | ICD-10-CM | POA: Diagnosis not present

## 2022-12-09 DIAGNOSIS — M546 Pain in thoracic spine: Secondary | ICD-10-CM | POA: Diagnosis not present

## 2022-12-15 DIAGNOSIS — M9902 Segmental and somatic dysfunction of thoracic region: Secondary | ICD-10-CM | POA: Diagnosis not present

## 2022-12-15 DIAGNOSIS — M62830 Muscle spasm of back: Secondary | ICD-10-CM | POA: Diagnosis not present

## 2022-12-15 DIAGNOSIS — M546 Pain in thoracic spine: Secondary | ICD-10-CM | POA: Diagnosis not present

## 2022-12-15 DIAGNOSIS — M9903 Segmental and somatic dysfunction of lumbar region: Secondary | ICD-10-CM | POA: Diagnosis not present

## 2022-12-18 ENCOUNTER — Ambulatory Visit (HOSPITAL_COMMUNITY)
Admission: RE | Admit: 2022-12-18 | Discharge: 2022-12-18 | Disposition: A | Discharge status: Home / Self Care | Payer: BC Managed Care – PPO | Source: Ambulatory Visit | Attending: Family Medicine | Admitting: Family Medicine

## 2022-12-18 DIAGNOSIS — E782 Mixed hyperlipidemia: Secondary | ICD-10-CM | POA: Insufficient documentation

## 2022-12-23 ENCOUNTER — Ambulatory Visit (HOSPITAL_COMMUNITY): Payer: BC Managed Care – PPO

## 2022-12-23 ENCOUNTER — Encounter (HOSPITAL_COMMUNITY): Payer: Self-pay

## 2022-12-23 DIAGNOSIS — M9903 Segmental and somatic dysfunction of lumbar region: Secondary | ICD-10-CM | POA: Diagnosis not present

## 2022-12-23 DIAGNOSIS — M546 Pain in thoracic spine: Secondary | ICD-10-CM | POA: Diagnosis not present

## 2022-12-23 DIAGNOSIS — M9902 Segmental and somatic dysfunction of thoracic region: Secondary | ICD-10-CM | POA: Diagnosis not present

## 2022-12-23 DIAGNOSIS — M62830 Muscle spasm of back: Secondary | ICD-10-CM | POA: Diagnosis not present

## 2022-12-30 DIAGNOSIS — M9902 Segmental and somatic dysfunction of thoracic region: Secondary | ICD-10-CM | POA: Diagnosis not present

## 2022-12-30 DIAGNOSIS — M62830 Muscle spasm of back: Secondary | ICD-10-CM | POA: Diagnosis not present

## 2022-12-30 DIAGNOSIS — M546 Pain in thoracic spine: Secondary | ICD-10-CM | POA: Diagnosis not present

## 2022-12-30 DIAGNOSIS — M9903 Segmental and somatic dysfunction of lumbar region: Secondary | ICD-10-CM | POA: Diagnosis not present

## 2023-01-11 DIAGNOSIS — M62830 Muscle spasm of back: Secondary | ICD-10-CM | POA: Diagnosis not present

## 2023-01-11 DIAGNOSIS — M9903 Segmental and somatic dysfunction of lumbar region: Secondary | ICD-10-CM | POA: Diagnosis not present

## 2023-01-11 DIAGNOSIS — M9902 Segmental and somatic dysfunction of thoracic region: Secondary | ICD-10-CM | POA: Diagnosis not present

## 2023-01-11 DIAGNOSIS — M546 Pain in thoracic spine: Secondary | ICD-10-CM | POA: Diagnosis not present

## 2023-02-08 DIAGNOSIS — M9902 Segmental and somatic dysfunction of thoracic region: Secondary | ICD-10-CM | POA: Diagnosis not present

## 2023-02-08 DIAGNOSIS — M546 Pain in thoracic spine: Secondary | ICD-10-CM | POA: Diagnosis not present

## 2023-02-08 DIAGNOSIS — M9903 Segmental and somatic dysfunction of lumbar region: Secondary | ICD-10-CM | POA: Diagnosis not present

## 2023-02-08 DIAGNOSIS — M62830 Muscle spasm of back: Secondary | ICD-10-CM | POA: Diagnosis not present

## 2023-02-09 DIAGNOSIS — I781 Nevus, non-neoplastic: Secondary | ICD-10-CM | POA: Diagnosis not present

## 2023-02-09 DIAGNOSIS — L570 Actinic keratosis: Secondary | ICD-10-CM | POA: Diagnosis not present

## 2023-04-08 DIAGNOSIS — M9902 Segmental and somatic dysfunction of thoracic region: Secondary | ICD-10-CM | POA: Diagnosis not present

## 2023-04-08 DIAGNOSIS — M62830 Muscle spasm of back: Secondary | ICD-10-CM | POA: Diagnosis not present

## 2023-04-08 DIAGNOSIS — M9903 Segmental and somatic dysfunction of lumbar region: Secondary | ICD-10-CM | POA: Diagnosis not present

## 2023-04-08 DIAGNOSIS — M546 Pain in thoracic spine: Secondary | ICD-10-CM | POA: Diagnosis not present

## 2023-05-31 DIAGNOSIS — M9902 Segmental and somatic dysfunction of thoracic region: Secondary | ICD-10-CM | POA: Diagnosis not present

## 2023-05-31 DIAGNOSIS — M9905 Segmental and somatic dysfunction of pelvic region: Secondary | ICD-10-CM | POA: Diagnosis not present

## 2023-05-31 DIAGNOSIS — M62830 Muscle spasm of back: Secondary | ICD-10-CM | POA: Diagnosis not present

## 2023-05-31 DIAGNOSIS — M9903 Segmental and somatic dysfunction of lumbar region: Secondary | ICD-10-CM | POA: Diagnosis not present

## 2023-07-02 DIAGNOSIS — M546 Pain in thoracic spine: Secondary | ICD-10-CM | POA: Diagnosis not present

## 2023-07-02 DIAGNOSIS — M9902 Segmental and somatic dysfunction of thoracic region: Secondary | ICD-10-CM | POA: Diagnosis not present

## 2023-07-02 DIAGNOSIS — M542 Cervicalgia: Secondary | ICD-10-CM | POA: Diagnosis not present

## 2023-07-02 DIAGNOSIS — M9901 Segmental and somatic dysfunction of cervical region: Secondary | ICD-10-CM | POA: Diagnosis not present

## 2023-07-21 DIAGNOSIS — M9902 Segmental and somatic dysfunction of thoracic region: Secondary | ICD-10-CM | POA: Diagnosis not present

## 2023-07-21 DIAGNOSIS — M9905 Segmental and somatic dysfunction of pelvic region: Secondary | ICD-10-CM | POA: Diagnosis not present

## 2023-07-21 DIAGNOSIS — M9903 Segmental and somatic dysfunction of lumbar region: Secondary | ICD-10-CM | POA: Diagnosis not present

## 2023-07-21 DIAGNOSIS — M62830 Muscle spasm of back: Secondary | ICD-10-CM | POA: Diagnosis not present

## 2023-07-22 DIAGNOSIS — M9903 Segmental and somatic dysfunction of lumbar region: Secondary | ICD-10-CM | POA: Diagnosis not present

## 2023-07-22 DIAGNOSIS — M9902 Segmental and somatic dysfunction of thoracic region: Secondary | ICD-10-CM | POA: Diagnosis not present

## 2023-07-22 DIAGNOSIS — M9905 Segmental and somatic dysfunction of pelvic region: Secondary | ICD-10-CM | POA: Diagnosis not present

## 2023-07-22 DIAGNOSIS — M62830 Muscle spasm of back: Secondary | ICD-10-CM | POA: Diagnosis not present

## 2023-08-13 DIAGNOSIS — L239 Allergic contact dermatitis, unspecified cause: Secondary | ICD-10-CM | POA: Diagnosis not present

## 2023-12-01 DIAGNOSIS — E782 Mixed hyperlipidemia: Secondary | ICD-10-CM | POA: Diagnosis not present

## 2023-12-01 DIAGNOSIS — R7301 Impaired fasting glucose: Secondary | ICD-10-CM | POA: Diagnosis not present

## 2023-12-04 ENCOUNTER — Ambulatory Visit
Admission: EM | Admit: 2023-12-04 | Discharge: 2023-12-04 | Disposition: A | Discharge status: Home / Self Care | Attending: Family Medicine | Admitting: Family Medicine

## 2023-12-04 ENCOUNTER — Other Ambulatory Visit: Payer: Self-pay

## 2023-12-04 ENCOUNTER — Encounter: Payer: Self-pay | Admitting: Emergency Medicine

## 2023-12-04 DIAGNOSIS — R002 Palpitations: Secondary | ICD-10-CM | POA: Diagnosis not present

## 2023-12-04 DIAGNOSIS — R42 Dizziness and giddiness: Secondary | ICD-10-CM

## 2023-12-04 DIAGNOSIS — R000 Tachycardia, unspecified: Secondary | ICD-10-CM

## 2023-12-04 LAB — GLUCOSE, POCT (MANUAL RESULT ENTRY): POC Glucose: 215 mg/dL — AB (ref 70–99)

## 2023-12-04 NOTE — Discharge Instructions (Signed)
 As discussed today, your blood pressures, heart rate and blood sugar are still running a bit high but heart rate has come down significantly from your episode earlier today.  Your EKG today did not show any arrhythmias which is reassuring.  I recommend following up as soon as possible with your primary care provider for a recheck and further evaluation as felt necessary and going to the emergency department for worsening symptoms in the meantime.

## 2023-12-04 NOTE — ED Notes (Signed)
 Pt denies having EKG in the past. 2 Copies of EKG printed for provider review.

## 2023-12-04 NOTE — ED Triage Notes (Addendum)
 Pt reports was working around the house and bent over the tub to clean out a cooler and reports heart rate increased to 198 according to my watch. Pt reports dizziness at time of event. Pt denies any chest pain, shortness of breath, dizziness at this time. Pt alert and oriented. Non-diaphoretic.   Pt reports intermittently fasts. Had 48 hour fast this week.

## 2023-12-08 DIAGNOSIS — R030 Elevated blood-pressure reading, without diagnosis of hypertension: Secondary | ICD-10-CM | POA: Diagnosis not present

## 2023-12-08 DIAGNOSIS — E782 Mixed hyperlipidemia: Secondary | ICD-10-CM | POA: Diagnosis not present

## 2023-12-08 DIAGNOSIS — Z0001 Encounter for general adult medical examination with abnormal findings: Secondary | ICD-10-CM | POA: Diagnosis not present

## 2023-12-08 DIAGNOSIS — R7301 Impaired fasting glucose: Secondary | ICD-10-CM | POA: Diagnosis not present

## 2023-12-08 DIAGNOSIS — Z0000 Encounter for general adult medical examination without abnormal findings: Secondary | ICD-10-CM | POA: Diagnosis not present

## 2023-12-08 NOTE — ED Provider Notes (Signed)
 [facility]    CSN: [id number] Arrival date & time: 12/04/23  1446      History   Chief Complaint Chief Complaint  Patient presents with   Palpitations    HPI Kurt Rosales is a 58 y.o. male.   Patient presenting today with an episode just prior to arrival where he was bent over cleaning out a cooler and became lightheaded upon standing back up. Notes his watch during this episode was reading a HR of between 120-198. Since the episode states HR has now come down to 100-110 range consistently. Denies CP, SOB, palpitations, visual change,  mental status changes, fever, history of similar episodes. Denies chronic medical problems. Not trying anything OTC for sxs. Does have lab results from this week's physical which were largely WNL.     Past Medical History:  Diagnosis Date   Heart murmur after rheumatic heart disease    had Rheumatic fever as a child   History of kidney stones     Patient Active Problem List   Diagnosis Date Noted   Adenomatous polyp of sigmoid colon 11/17/2022   Special screening for malignant neoplasms, colon 05/14/2017    Past Surgical History:  Procedure Laterality Date   COLONOSCOPY N/A 08/25/2017   Procedure: COLONOSCOPY;  Surgeon: Golda Claudis PENNER, MD;  Location: [facility];  Service: Endoscopy;  Laterality: N/A;  830   COLONOSCOPY WITH PROPOFOL  N/A 11/17/2022   Procedure: COLONOSCOPY WITH PROPOFOL ;  Surgeon: Cinderella Deatrice FALCON, MD;  Location: [facility];  Service: Endoscopy;  Laterality: N/A;  1:30 pm, asa 1   HOT HEMOSTASIS  11/17/2022   Procedure: HOT HEMOSTASIS (ARGON PLASMA COAGULATION/BICAP);  Surgeon: Cinderella Deatrice FALCON, MD;  Location: [facility];  Service: Endoscopy;;   POLYPECTOMY  08/25/2017   Procedure: POLYPECTOMY;  Surgeon: Golda Claudis PENNER, MD;  [facility]: [facility];  Service: Endoscopy;;  colon   POLYPECTOMY  11/17/2022   Procedure: POLYPECTOMY;  Surgeon: Cinderella Deatrice FALCON, MD;  Location: [facility];  Service:  Endoscopy;;       Home Medications    Prior to Admission medications   Not on File    Family History History reviewed. No pertinent family history.  Social History Social History   Tobacco Use   Smoking status: Some Days    Types: Pipe   Smokeless tobacco: Former  Building Services Engineer status: Never Used  Substance Use Topics   Alcohol use: Yes    Comment: occ   Drug use: No     Allergies   Bee venom   Review of Systems Review of Systems PER HPI  Physical Exam Triage Vital Signs ED Triage Vitals  Encounter Vitals Group     BP 12/04/23 1449 (!) 152/91     Girls Systolic BP Percentile --      Girls Diastolic BP Percentile --      Boys Systolic BP Percentile --      Boys Diastolic BP Percentile --      Pulse Rate 12/04/23 1449 (!) 106     Resp 12/04/23 1449 20     Temp 12/04/23 1449 98.7 F (37.1 C)     Temp Source 12/04/23 1449 Oral     SpO2 12/04/23 1449 94 %     Weight --      Height --      Head Circumference --      Peak Flow --      Pain Score 12/04/23 1451 0  Pain Loc --      Pain Education --      Exclude from Growth Chart --    No data found.  Updated Vital Signs BP (!) 152/91 (BP Location: Right Arm)   Pulse (!) 106   Temp 98.7 F (37.1 C) (Oral)   Resp 20   SpO2 94%   Visual Acuity Right Eye Distance:   Left Eye Distance:   Bilateral Distance:    Right Eye Near:   Left Eye Near:    Bilateral Near:     Physical Exam Vitals and nursing note reviewed.  Constitutional:      Appearance: Normal appearance.  HENT:     Head: Atraumatic.  Eyes:     Extraocular Movements: Extraocular movements intact.     Conjunctiva/sclera: Conjunctivae normal.  Cardiovascular:     Rate and Rhythm: Regular rhythm. Tachycardia present.  Pulmonary:     Effort: Pulmonary effort is normal.     Breath sounds: Normal breath sounds.  Musculoskeletal:        General: Normal range of motion.     Cervical back: Normal range of motion and neck  supple.  Skin:    General: Skin is warm and dry.  Neurological:     General: No focal deficit present.     Mental Status: He is oriented to person, place, and time.     Cranial Nerves: No cranial nerve deficit.     Motor: No weakness.     Gait: Gait normal.     Comments: All 4 extremities neurovascularly intact  Psychiatric:        Mood and Affect: Mood normal.        Thought Content: Thought content normal.        Judgment: Judgment normal.      UC Treatments / Results  Labs (all labs ordered are listed, but only abnormal results are displayed) Labs Reviewed  GLUCOSE, POCT (MANUAL RESULT ENTRY) - Abnormal; Notable for the following components:      Result Value   POC Glucose 215 (*)    All other components within normal limits    EKG   Radiology No results found.  Procedures Procedures (including critical care time)  Medications Ordered in UC Medications - No data to display  Initial Impression / Assessment and Plan / UC Course  I have reviewed the triage vital signs and the nursing notes.  Pertinent labs & imaging results that were available during my care of the patient were reviewed by me and considered in my medical decision making (see chart for details).     Hypertensive and mildly tachycardic in triage, POC glucose 215 random, EKG today showing NSR at 96 bpm without acute ST elevations, orthostatic VS with significant change from laying to sitting. Labs from physical this week reviewed on patient's phone and reassuring and he declines repeat labs today. Discussed that we could not r/o further emergent causes and if sxs worsened he should go to the ER but overall appears stable currently and feeling mostly better. Increase fluids, rest, and close PCP f/u next week. Possibly candidate for event monitor given run of severe tachycardia. Again, ED precautions reviewed.  Final Clinical Impressions(s) / UC Diagnoses   Final diagnoses:  Dizziness  Palpitations   Tachycardia     Discharge Instructions      As discussed today, your blood pressures, heart rate and blood sugar are still running a bit high but heart rate has come down significantly from  your episode earlier today.  Your EKG today did not show any arrhythmias which is reassuring.  I recommend following up as soon as possible with your primary care provider for a recheck and further evaluation as felt necessary and going to the emergency department for worsening symptoms in the meantime.    ED Prescriptions   None    PDMP not reviewed this encounter.   Stuart Millman [place], [place] 12/08/23 [phone]
